# Patient Record
Sex: Male | Born: 1990 | Race: White | Hispanic: No | Marital: Single | Smoking: Current every day smoker
Health system: Southern US, Community
[De-identification: ages and names within clinical notes are randomized; demographics above are authoritative.]

---

## 2011-02-18 ENCOUNTER — Ambulatory Visit: Payer: Self-pay | Admitting: Family Medicine

## 2013-01-16 ENCOUNTER — Emergency Department: Payer: Self-pay | Admitting: Unknown Physician Specialty

## 2018-08-05 ENCOUNTER — Emergency Department: Payer: Self-pay

## 2018-08-05 ENCOUNTER — Encounter: Payer: Self-pay | Admitting: *Deleted

## 2018-08-05 ENCOUNTER — Emergency Department
Admission: EM | Admit: 2018-08-05 | Discharge: 2018-08-05 | Disposition: A | Discharge status: Home / Self Care | Payer: Self-pay | Attending: Emergency Medicine | Admitting: Emergency Medicine

## 2018-08-05 ENCOUNTER — Other Ambulatory Visit: Payer: Self-pay

## 2018-08-05 DIAGNOSIS — N23 Unspecified renal colic: Secondary | ICD-10-CM | POA: Insufficient documentation

## 2018-08-05 DIAGNOSIS — F17200 Nicotine dependence, unspecified, uncomplicated: Secondary | ICD-10-CM | POA: Insufficient documentation

## 2018-08-05 LAB — URINALYSIS, COMPLETE (UACMP) WITH MICROSCOPIC
BACTERIA UA: NONE SEEN
Bilirubin Urine: NEGATIVE
GLUCOSE, UA: NEGATIVE mg/dL
KETONES UR: NEGATIVE mg/dL
Leukocytes, UA: NEGATIVE
Nitrite: NEGATIVE
PROTEIN: 100 mg/dL — AB
Specific Gravity, Urine: 1.023 (ref 1.005–1.030)
pH: 6 (ref 5.0–8.0)

## 2018-08-05 LAB — BASIC METABOLIC PANEL
Anion gap: 8 (ref 5–15)
BUN: 12 mg/dL (ref 6–20)
CO2: 24 mmol/L (ref 22–32)
CREATININE: 0.92 mg/dL (ref 0.61–1.24)
Calcium: 9.5 mg/dL (ref 8.9–10.3)
Chloride: 107 mmol/L (ref 98–111)
GFR calc Af Amer: >60 mL/min (ref >60)
GFR calc non Af Amer: >60 mL/min (ref >60)
Glucose, Bld: 93 mg/dL (ref 70–99)
POTASSIUM: 4.1 mmol/L (ref 3.5–5.1)
SODIUM: 139 mmol/L (ref 135–145)

## 2018-08-05 LAB — CBC
HEMATOCRIT: 44.7 % (ref 40.0–52.0)
Hemoglobin: 15.8 g/dL (ref 13.0–18.0)
MCH: 31.7 pg (ref 26.0–34.0)
MCHC: 35.3 g/dL (ref 32.0–36.0)
MCV: 89.8 fL (ref 80.0–100.0)
PLATELETS: 238 10*3/uL (ref 150–440)
RBC: 4.98 MIL/uL (ref 4.40–5.90)
RDW: 12.1 % (ref 11.5–14.5)
WBC: 9 10*3/uL (ref 3.8–10.6)

## 2018-08-05 MED ORDER — TAMSULOSIN HCL 0.4 MG PO CAPS: 0.4000 mg | ORAL_CAPSULE | Freq: Every day | ORAL | 0 refills | Status: DC

## 2018-08-05 MED ORDER — KETOROLAC TROMETHAMINE 10 MG PO TABS: 10.0000 mg | ORAL_TABLET | Freq: Three times a day (TID) | ORAL | 0 refills | Status: DC | PRN

## 2018-08-05 MED ORDER — OXYCODONE-ACETAMINOPHEN 5-325 MG PO TABS: 1.0000 | ORAL_TABLET | ORAL | 0 refills | Status: AC | PRN

## 2018-08-05 MED ORDER — ONDANSETRON 4 MG PO TBDP: 4.0000 mg | ORAL_TABLET | Freq: Three times a day (TID) | ORAL | 0 refills | Status: DC | PRN

## 2018-08-05 MED ORDER — ONDANSETRON 4 MG PO TBDP
4.0000 mg | ORAL_TABLET | Freq: Once | ORAL | Status: AC
  Administered 2018-08-05: 4 mg via ORAL
  Filled 2018-08-05: qty 1

## 2018-08-05 MED ORDER — KETOROLAC TROMETHAMINE 60 MG/2ML IM SOLN
60.0000 mg | Freq: Once | INTRAMUSCULAR | Status: AC
  Administered 2018-08-05: 60 mg via INTRAMUSCULAR
  Filled 2018-08-05: qty 2

## 2018-08-05 NOTE — ED Notes (Signed)
Pt unable to void at this time. 

## 2018-08-05 NOTE — ED Provider Notes (Signed)
[facility] Emergency Department Provider Note  ____________________________________________  Time seen: Approximately 8:45 PM  I have reviewed the triage vital signs and the nursing notes.   HISTORY  Chief Complaint Hematuria and Flank Pain    HPI Dakota Jenkins is a 27 y.o. male, otherwise healthy, presenting for right flank pain radiating to the right lower quadrant.  The patient reports that yesterday he began having a dull ache, and had one episode of nausea and vomiting.  He thought he had the flu so he left work.  Today, his flank pain worsened and then he had an episode of hematuria so came straight to the emergency department.  He denies any fevers or chills.  He has no history of kidney stones.  FH: Grandfather with significant history of renal stones.  No past medical history on file.  There are no active problems to display for this patient.       Allergies Patient has no known allergies.  No family history on file.  Social History Social History   Tobacco Use  . Smoking status: Current Every Day Smoker  . Smokeless tobacco: Never Used  Substance Use Topics  . Alcohol use: Yes  . Drug use: Never    Review of Systems Constitutional: No fever/chills.  No lightheadedness or syncope. Eyes: No visual changes. ENT:  No congestion or rhinorrhea. Cardiovascular: Denies chest pain. Denies palpitations. Respiratory: Denies shortness of breath.  No cough. Gastrointestinal: + RLQ abdominal pain.  +nausea, +vomiting.  No diarrhea.  No constipation. Genitourinary: Negative for dysuria.  Positive hematuria.  No urinary frequency. Musculoskeletal: Negative for back pain except for right flank pain.. Skin: Negative for rash. Neurological: Negative for headaches. No focal numbness, tingling or weakness.     ____________________________________________   PHYSICAL EXAM:  VITAL SIGNS: ED Triage Vitals  Enc Vitals Group     BP  08/05/18 1924 (!) 142/78     Pulse Rate 08/05/18 1924 74     Resp 08/05/18 1924 20     Temp 08/05/18 1924 98.6 F (37 C)     Temp Source 08/05/18 1924 Oral     SpO2 08/05/18 1924 98 %     Weight 08/05/18 1925 250 lb (113.4 kg)     Height 08/05/18 1925 6\' 2"  (1.88 m)     Head Circumference --      Peak Flow --      Pain Score 08/05/18 1925 4     Pain Loc --      Pain Edu? --      Excl. in GC? --     Constitutional: Alert and oriented. Answers questions appropriately. Eyes: Conjunctivae are normal.  EOMI. No scleral icterus. Head: Atraumatic. Nose: No congestion/rhinnorhea. Mouth/Throat: Mucous membranes are moist.  Neck: No stridor.  Supple.   Cardiovascular: Normal rate, regular rhythm. No murmurs, rubs or gallops.  Respiratory: Normal respiratory effort.  No accessory muscle use or retractions. Lungs CTAB.  No wheezes, rales or ronchi. Gastrointestinal: Positive right CVA tenderness with mild right lower quadrant tenderness to palpation.  Soft, and nondistended.  No guarding or rebound.  No peritoneal signs. Musculoskeletal: No LE edema. No ttp in the calves or palpable cords.  Negative Homan's sign. Neurologic:  A&Ox3.  Speech is clear.  Face and smile are symmetric.  EOMI.  Moves all extremities well. Skin:  Skin is warm, dry and intact. No rash noted. Psychiatric: Mood and affect are normal. Speech and behavior are normal.  Normal judgement.  ____________________________________________   LABS (all labs ordered are listed, but only abnormal results are displayed)  Labs Reviewed  BASIC METABOLIC PANEL  CBC  URINALYSIS, COMPLETE (UACMP) WITH MICROSCOPIC   ____________________________________________  EKG  Not indicated ____________________________________________  RADIOLOGY  Ct Renal Stone Study  Result Date: 08/05/2018 CLINICAL DATA:  RIGHT flank and lower back pain for 4 days worse today, hematuria and vomiting today, does heavy lifting at work, history smoking  EXAM: CT ABDOMEN AND PELVIS WITHOUT CONTRAST TECHNIQUE: Multidetector CT imaging of the abdomen and pelvis was performed following the standard protocol without IV contrast. Sagittal and coronal MPR images reconstructed from axial data set. Oral contrast not administered for this indication. COMPARISON:  None. FINDINGS: Lower chest: Lung bases clear Hepatobiliary: Gallbladder and liver normal appearance Pancreas: Normal appearance Spleen: Normal appearance Adrenals/Urinary Tract: Adrenal glands normal appearance. Mild RIGHT hydronephrosis secondary to a 2 mm RIGHT ureteropelvic junction calculus. No evidence of renal mass or additional calcification. Unremarkable ureters and bladder. Stomach/Bowel: Normal appendix. Stomach and bowel loops normal appearance Vascular/Lymphatic: Aorta normal caliber. No adenopathy. Normal sized inguinal nodes. Reproductive: Prostatic enlargement, gland 4.4 x 4.5 x 3.8 cm (volume = 39 cm^3) Other: No free air or free fluid. No hernia or additional inflammatory process. Musculoskeletal: Unremarkable IMPRESSION: Mild RIGHT hydronephrosis secondary to a 2 mm RIGHT UPJ calculus. Prostatic enlargement. Electronically Signed   By: Ulyses SouthwardMark  Boles M.D.   On: 08/05/2018 20:31    ____________________________________________   PROCEDURES  Procedure(s) performed: None  Procedures  Critical Care performed: No ____________________________________________   INITIAL IMPRESSION / ASSESSMENT AND PLAN / ED COURSE  Pertinent labs & imaging results that were available during my care of the patient were reviewed by me and considered in my medical decision making (see chart for details).  27 y.o. male, otherwise healthy with a family history of renal colic, presenting with right flank pain radiating to the right lower quadrant and one episode of nausea and vomiting yesterday.  Overall, the patient is dynamically stable and afebrile.  He has a work-up in the emergency department which is most  consistent with renal colic.  He has a CT scan which is a 2 mm UPJ stone without any significant complications.  He has had a normal white blood cell count at 9.0 and a creatinine of 0.92.  His urinalysis is pending.  At this time, the patient's pain has significantly improved without intervention.  He will be given Toradol, Zofran, and oral hydration.  I have discussed expectant management, follow-up instructions as well as return precautions with the patient.  ____________________________________________  FINAL CLINICAL IMPRESSION(S) / ED DIAGNOSES  Final diagnoses:  Renal colic on right side         NEW MEDICATIONS STARTED DURING THIS VISIT:  New Prescriptions   KETOROLAC (TORADOL) 10 MG TABLET    Take 1 tablet (10 mg total) by mouth every 8 (eight) hours as needed for moderate pain (with food).   ONDANSETRON (ZOFRAN ODT) 4 MG DISINTEGRATING TABLET    Take 1 tablet (4 mg total) by mouth every 8 (eight) hours as needed for nausea or vomiting.   OXYCODONE-ACETAMINOPHEN (PERCOCET) 5-325 MG TABLET    Take 1 tablet by mouth every 4 (four) hours as needed for severe pain.   TAMSULOSIN (FLOMAX) 0.4 MG CAPS CAPSULE    Take 1 capsule (0.4 mg total) by mouth daily.      Rockne MenghiniNorman, Anne-Caroline, MD 08/05/18 2055

## 2018-08-05 NOTE — Discharge Instructions (Addendum)
Please drink plenty of fluid to be well-hydrated and to help your kidney stone passed.  You may take Toradol for mild to moderate pain, and Percocet for severe pain.  Do not drive within 8 hours of taking Percocet.  Zofran is for nausea and vomiting.  Please make a follow-up appointment with the urology doctor.  Please strain your urine and if you capture the stone, bring it with you to your appointment.  Return to the emergency department if you develop severe pain, lightheadedness or fainting fever, inability to keep down fluids, or any other symptoms concerning to you.

## 2018-08-05 NOTE — ED Triage Notes (Signed)
Pt has right flank pain with hematuria.  Pt vomited x 1.  No hx kidney stones  Pt alert

## 2018-08-12 ENCOUNTER — Encounter: Payer: Self-pay | Admitting: Urology

## 2018-08-12 ENCOUNTER — Ambulatory Visit (INDEPENDENT_AMBULATORY_CARE_PROVIDER_SITE_OTHER): Payer: Self-pay | Admitting: Urology

## 2018-08-12 VITALS — BP 130/75 | HR 66 | Ht 74.0 in | Wt 246.8 lb

## 2018-08-12 DIAGNOSIS — N200 Calculus of kidney: Secondary | ICD-10-CM

## 2018-08-12 DIAGNOSIS — N201 Calculus of ureter: Secondary | ICD-10-CM

## 2018-08-12 LAB — URINALYSIS, COMPLETE
Bilirubin, UA: NEGATIVE
Glucose, UA: NEGATIVE
KETONES UA: NEGATIVE
LEUKOCYTES UA: NEGATIVE
NITRITE UA: NEGATIVE
PH UA: 6 (ref 5.0–7.5)
SPEC GRAV UA: 1.025 (ref 1.005–1.030)
Urobilinogen, Ur: 1 mg/dL (ref 0.2–1.0)

## 2018-08-12 LAB — MICROSCOPIC EXAMINATION
Bacteria, UA: NONE SEEN
EPITHELIAL CELLS (NON RENAL): NONE SEEN /HPF (ref 0–10)

## 2018-08-12 NOTE — Progress Notes (Signed)
08/12/2018 4:02 PM   Dakota CoventryJoshua T Jenkins Jun 08, 1991 [id number]  CC: Right flank pain  HPI: The pleasure of seeing Dakota Jenkins in urology clinic today for right flank pain.  He was seen in the emergency room on 8/22 with acute onset of right-sided flank pain and hematuria, and CT scan showed a 2 mm right proximal ureteral stone.  He has never had kidney stones before.  He has been straining his urine and has not caught a stone yet.  He continues to have episodes of acute onset right renal colic with some radiation to the right groin.  His pain is controlled with Toradol and narcotics.  Denies nausea or vomiting.  Denies fevers, chills.  Strong family history of kidney stones in his grandfather.   PMH: None  Allergies: No Known Allergies  Family History: Family History  Problem Relation Age of Onset  . Bladder Cancer Neg Hx   . Prostate cancer Neg Hx   . Kidney cancer Neg Hx     Social History:  reports that he has been smoking. He has never used smokeless tobacco. He reports that he drinks alcohol. He reports that he does not use drugs.  ROS: Please see flowsheet from today's date for complete review of systems.  Physical Exam: BP 130/75 (BP Location: Left Arm, Patient Position: Sitting, Cuff Size: Normal)   Pulse 66   Ht 6\' 2"  (1.88 m)   Wt 246 lb 12.8 oz (111.9 kg)   BMI 31.69 kg/m    Constitutional:  Alert and oriented, No acute distress. Cardiovascular: No clubbing, cyanosis, or edema. Respiratory: Normal respiratory effort, no increased work of breathing. GI: Abdomen is soft, nontender, nondistended, no abdominal masses GU: Right CVA tendernesss Lymph: No cervical or inguinal lymphadenopathy. Skin: No rashes, bruises or suspicious lesions. Neurologic: Grossly intact, no focal deficits, moving all 4 extremities. Psychiatric: Normal mood and affect.  Laboratory Data: Urinalysis 8/22 noninfected   Pertinent Imaging: I have personally reviewed the CT stone  protocol 8/22: 2 mm right UPJ stone with moderate hydronephrosis.   Assessment & Plan:   Dakota Jenkins is a 27 year old healthy male with a noninfected right 2 mm UPJ stone.  We discussed various treatment options for urolithiasis including observation with or without medical expulsive therapy, shockwave lithotripsy (SWL), ureteroscopy and laser lithotripsy with stent placement, and percutaneous nephrolithotomy.  We discussed that management is based on stone size, location, density, patient co-morbidities, and patient preference.   Stones <455mm in size have a >80% spontaneous passage rate. Data surrounding the use of tamsulosin for medical expulsive therapy is controversial, but meta analyses suggests it is most efficacious for distal stones between 5-3410mm in size. Possible side effects include dizziness/lightheadedness, and retrograde ejaculation.  SWL has a lower stone free rate in a single procedure, but also a lower complication rate compared to ureteroscopy and avoids a stent and associated stent related symptoms. Possible complications include renal hematoma, steinstrasse, and need for additional treatment.  Ureteroscopy with laser lithotripsy and stent placement has a higher stone free rate than SWL in a single procedure, however increased complication rate including possible infection, ureteral injury, bleeding, and stent related morbidity. Common stent related symptoms include dysuria, urgency/frequency, and flank pain.  After an extensive discussion of the risks and benefits of the above treatment options, the patient would like to proceed with observation. Will continue to strain urine. Will cancel follow up ultrasound if he passes his stone, if not, with the small size of the stone is  a better candidate for URS over SWL.  Return in about 4 weeks (around 09/09/2018) for renal ultrasound.  Sondra Come, MD  [facility] [address] [place], [place] [address] [phone]

## 2018-08-12 NOTE — Patient Instructions (Signed)

## 2018-08-19 ENCOUNTER — Encounter: Payer: Self-pay | Admitting: Urology

## 2018-09-07 ENCOUNTER — Ambulatory Visit
Admission: RE | Admit: 2018-09-07 | Discharge: 2018-09-07 | Disposition: A | Discharge status: Home / Self Care | Payer: Self-pay | Source: Ambulatory Visit | Attending: Urology | Admitting: Urology

## 2018-09-07 DIAGNOSIS — N201 Calculus of ureter: Secondary | ICD-10-CM

## 2018-09-07 DIAGNOSIS — Z87442 Personal history of urinary calculi: Secondary | ICD-10-CM | POA: Insufficient documentation

## 2018-09-07 DIAGNOSIS — N1330 Unspecified hydronephrosis: Secondary | ICD-10-CM | POA: Insufficient documentation

## 2018-09-10 ENCOUNTER — Ambulatory Visit: Payer: Self-pay | Admitting: Urology

## 2018-09-10 ENCOUNTER — Ambulatory Visit (INDEPENDENT_AMBULATORY_CARE_PROVIDER_SITE_OTHER): Payer: Self-pay | Admitting: Urology

## 2018-09-10 ENCOUNTER — Encounter: Payer: Self-pay | Admitting: Urology

## 2018-09-10 VITALS — BP 155/81 | HR 71 | Resp 16 | Ht 74.0 in | Wt 249.2 lb

## 2018-09-10 DIAGNOSIS — N201 Calculus of ureter: Secondary | ICD-10-CM

## 2018-09-10 NOTE — Progress Notes (Signed)
   09/10/2018 2:46 PM   VALEN GILLISON [birth year] [id number]  Reason for visit: Follow up 2 mm right ureteral stone  HPI: I saw Mr. Kathi Der back in urology clinic today for follow-up of a 2 mm right UPJ stone.  When I saw him on 08/12/2018 he was having right-sided flank pain secondary to a 2 mm right UPJ stone.  He has only intermittently strained his urine since that time.  He denies any recurrence of his right-sided flank pain, and has been doing well.  He denies gross hematuria, nausea, or chills, or vomiting.  Renal ultrasound 09/07/2018 showed almost complete resolution of his right-sided hydronephrosis, and no proximal ureteral stone.   ROS: Please see flowsheet from today's date for complete review of systems.  Physical Exam: BP (!) 155/81 (BP Location: Left Arm, Patient Position: Sitting, Cuff Size: Normal)   Pulse 71   Resp 16   Ht 6\' 2"  (1.88 m)   Wt 249 lb 3.2 oz (113 kg)   BMI 32.00 kg/m    Constitutional:  Alert and oriented, No acute distress. Respiratory: Normal respiratory effort, no increased work of breathing. GI: Abdomen is soft, nontender, nondistended, no abdominal masses GU: no CVA tenderness Skin: No rashes, bruises or suspicious lesions. Neurologic: Grossly intact, no focal deficits, moving all 4 extremities. Psychiatric: Normal mood and affect  Pertinent Imaging:  I have personally reviewed the renal ultrasound dated 09/07/2018: Near complete resolution of the right-sided hydronephrosis.  Assessment & Plan:   In summary, Mr. Monsanto is a healthy 27 year old male who appears to have spontaneously passed his 2 mm right proximal ureteral stone.  His pain is completely resolved and ultrasound shows near complete resolution of his prior right-sided hydronephrosis.  We discussed general stone prevention strategies including adequate hydration with goal of producing 2.5 L of urine daily, increasing citric acid intake, increasing calcium intake during high  oxalate meals, minimizing animal protein, and decreasing salt intake. Information about dietary recommendations given today.   Follow up PRN  Sondra Come, MD  [facility] [address] [place], [place] [address] [phone]

## 2019-10-21 IMAGING — CT CT RENAL STONE PROTOCOL
2 of 4 series · 16 of 46 positions shown, 18 images · non-contrast
Comparison: None.

CLINICAL DATA: RIGHT flank and lower back pain for 4 days worse
today, hematuria and vomiting today, does heavy lifting at work,
history smoking

EXAM:
CT ABDOMEN AND PELVIS WITHOUT CONTRAST
TECHNIQUE: Multidetector CT imaging of the abdomen and pelvis was performed
following the standard protocol without IV contrast. Sagittal and
coronal MPR images reconstructed from axial data set. Oral contrast
not administered for this indication.

[Series 2: stone full standard · axial · 0.81mm/px · z∈[-1112,-622]mm · 13 of 108 slices shown, 15 images]
[im 5/108  soft-tissue]
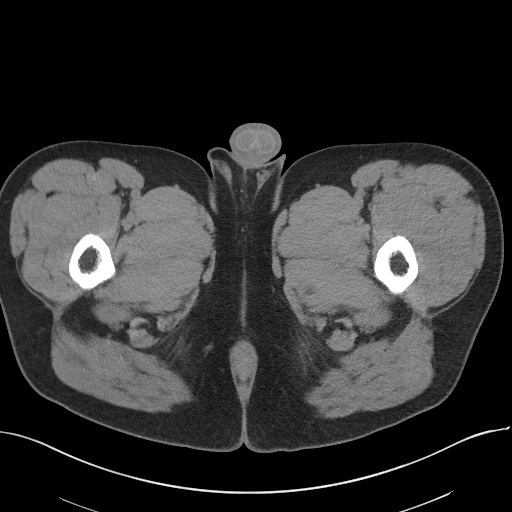
[im 5/108  bone]
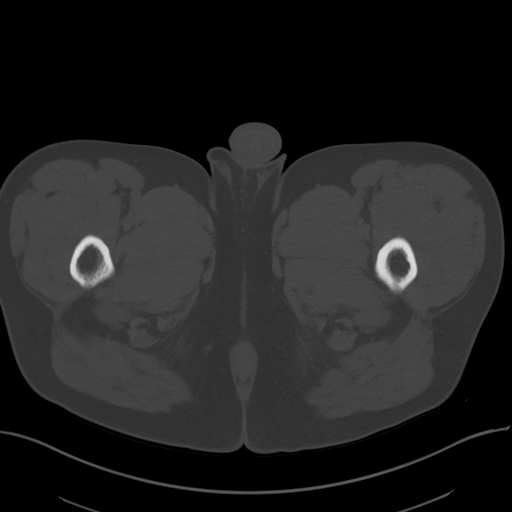
[im 14/108  soft-tissue]
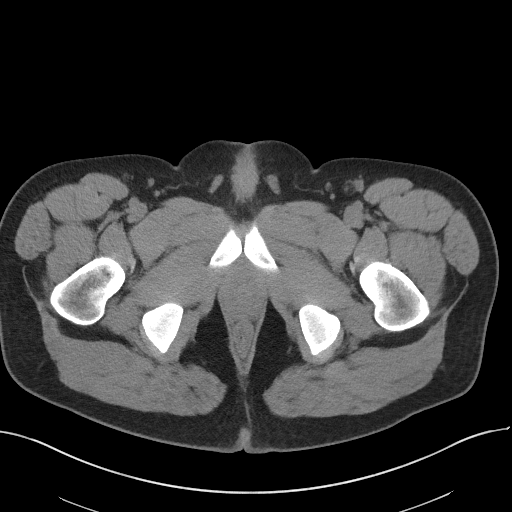
[im 23/108  soft-tissue]
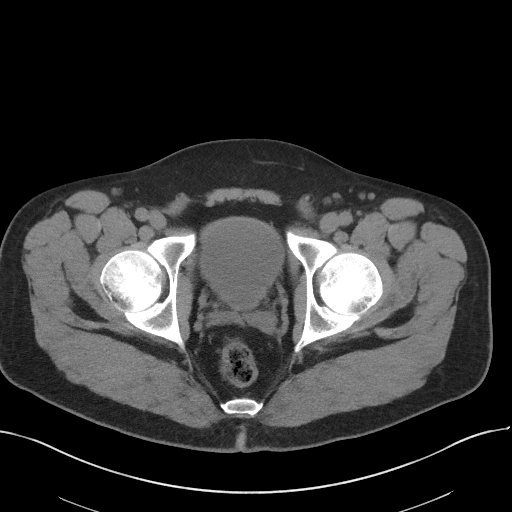
[im 32/108  soft-tissue]
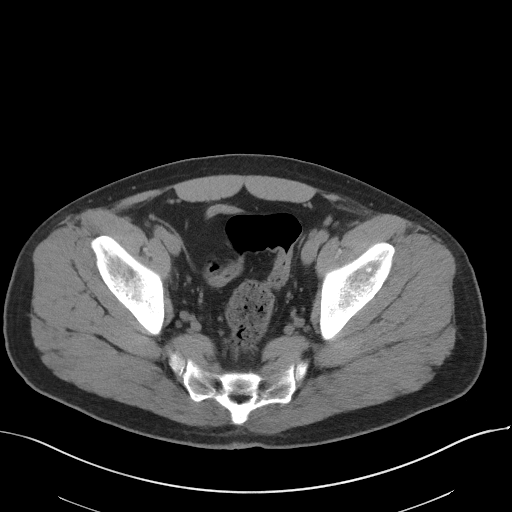
[im 36/108  soft-tissue]
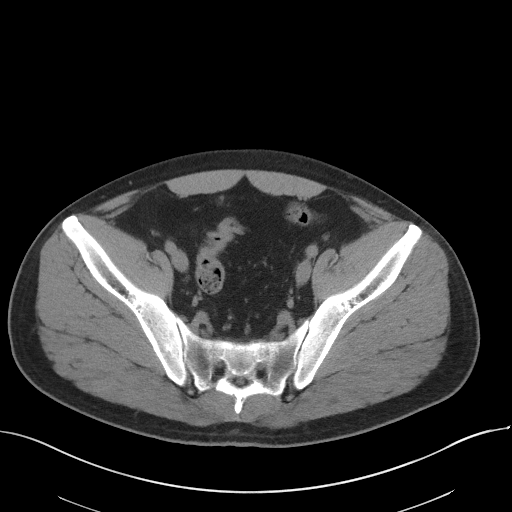
[im 45/108  soft-tissue]
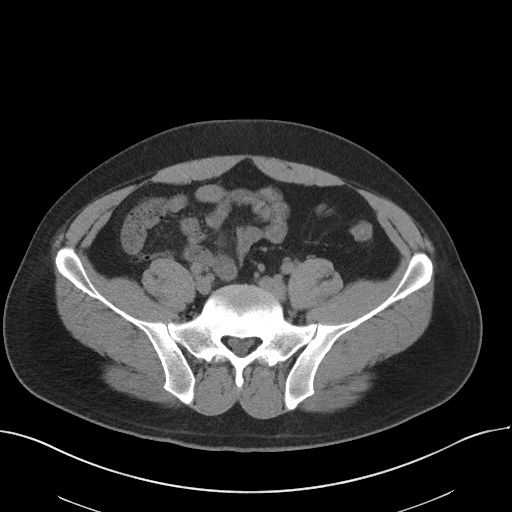
[im 54/108  soft-tissue]
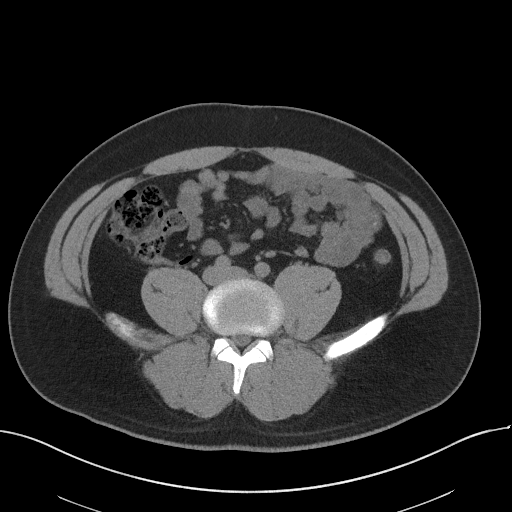
[im 63/108  soft-tissue]
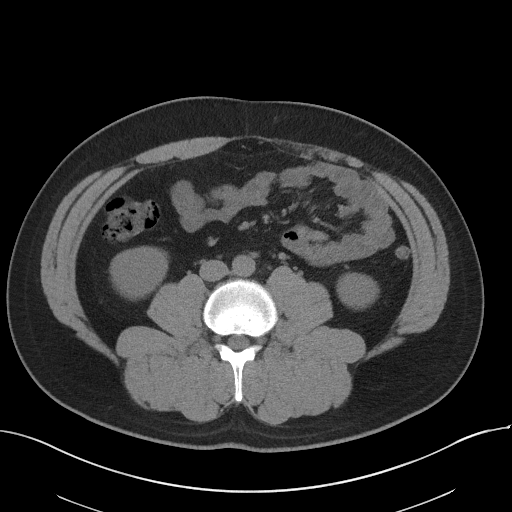
[im 72/108  soft-tissue]
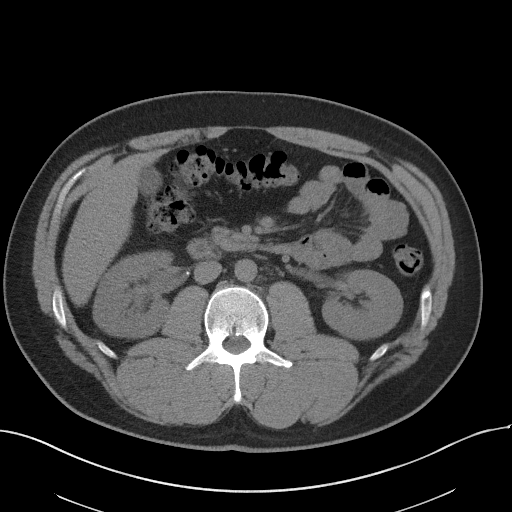
[im 72/108  bone]
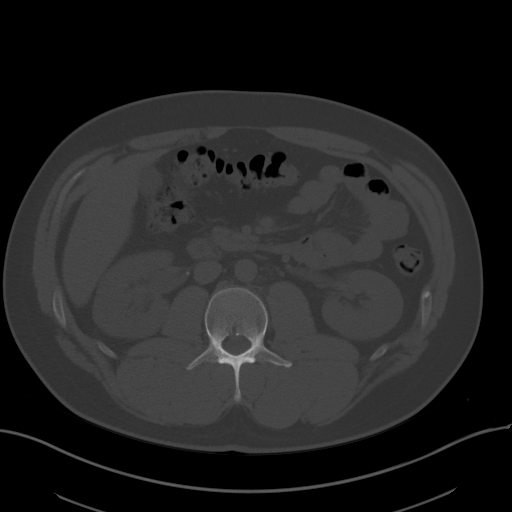
[im 76/108  soft-tissue]
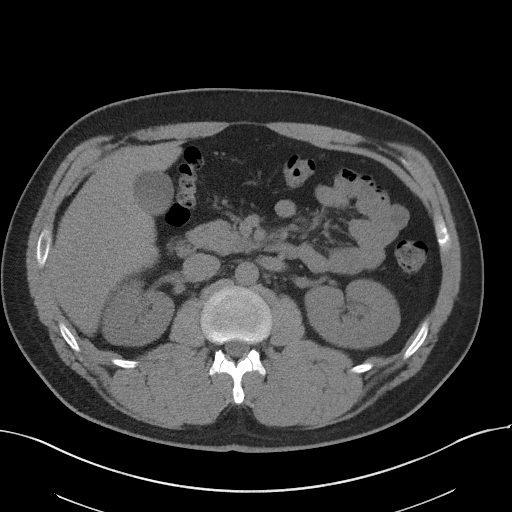
[im 85/108  soft-tissue]
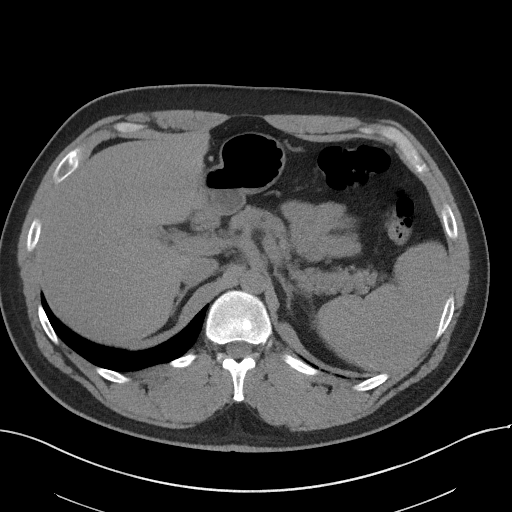
[im 94/108  soft-tissue]
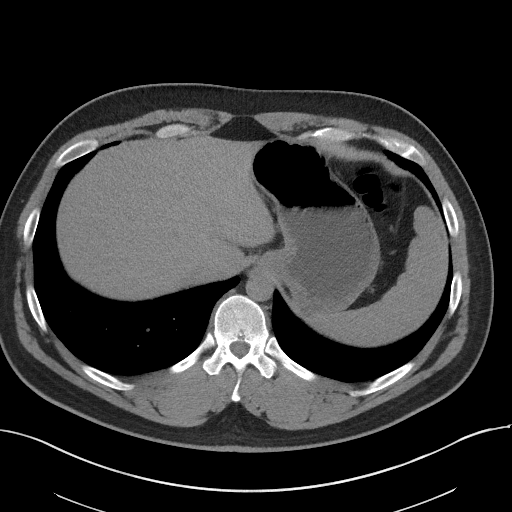
[im 103/108  soft-tissue]
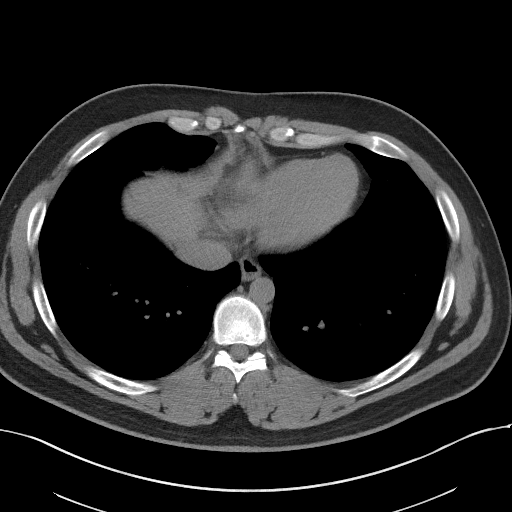

[Series 5: coronal · coronal · 0.80mm/px · 3 of 117 slices shown]
[im 39/117  soft-tissue]
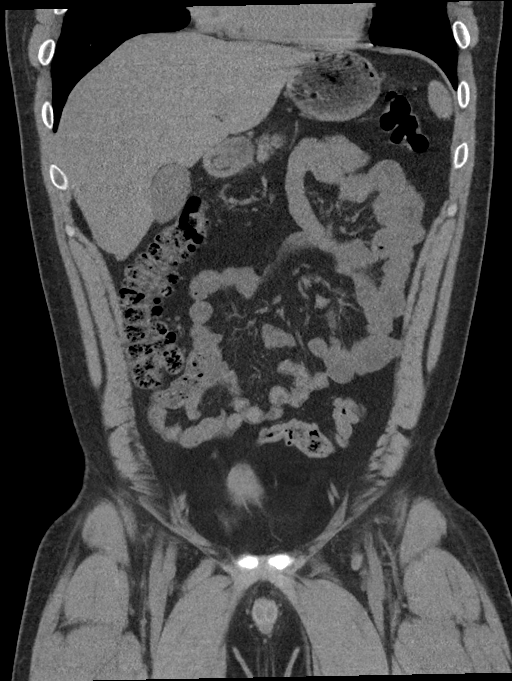
[im 52/117  soft-tissue]
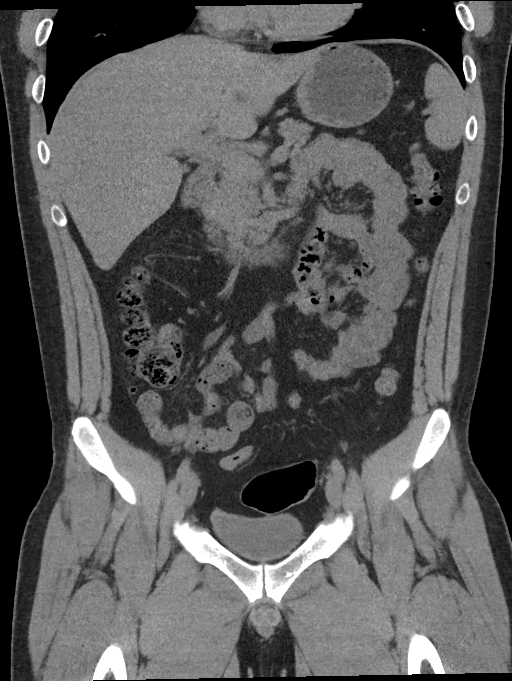
[im 65/117  soft-tissue]
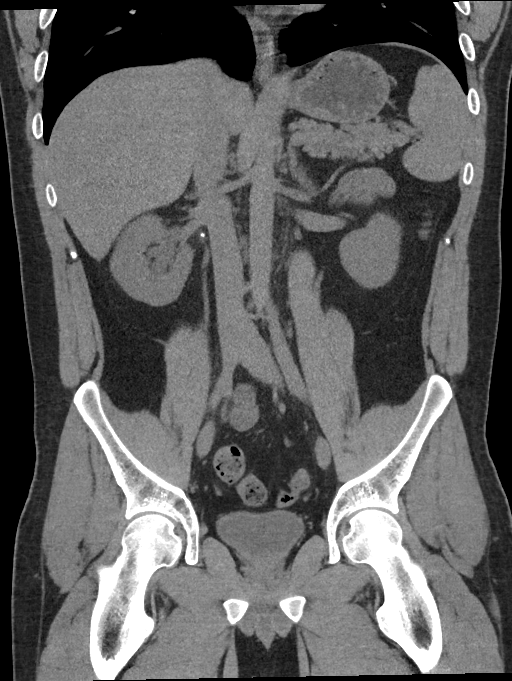

[16 of 46 positions shown; findings below may reference images not displayed]

FINDINGS: Lower chest: Lung bases clear

Hepatobiliary: Gallbladder and liver normal appearance

Pancreas: Normal appearance

Spleen: Normal appearance

Adrenals/Urinary Tract: Adrenal glands normal appearance. Mild RIGHT
hydronephrosis secondary to a 2 mm RIGHT ureteropelvic junction
calculus. No evidence of renal mass or additional calcification.
Unremarkable ureters and bladder.

Stomach/Bowel: Normal appendix. Stomach and bowel loops normal
appearance

Vascular/Lymphatic: Aorta normal caliber. No adenopathy. Normal
sized inguinal nodes.

Reproductive: Prostatic enlargement, gland 4.4 x 4.5 x 3.8 cm
(volume = 39 cm^3)

Other: No free air or free fluid. No hernia or additional
inflammatory process.

Musculoskeletal: Unremarkable
IMPRESSION: Mild RIGHT hydronephrosis secondary to a 2 mm RIGHT UPJ calculus.

Prostatic enlargement.

## 2020-08-07 DIAGNOSIS — M7542 Impingement syndrome of left shoulder: Secondary | ICD-10-CM | POA: Insufficient documentation

## 2022-04-13 ENCOUNTER — Emergency Department: Payer: Self-pay

## 2022-04-13 ENCOUNTER — Emergency Department
Admission: EM | Admit: 2022-04-13 | Discharge: 2022-04-13 | Disposition: A | Discharge status: Home / Self Care | Payer: Self-pay | Attending: Emergency Medicine | Admitting: Emergency Medicine

## 2022-04-13 ENCOUNTER — Other Ambulatory Visit: Payer: Self-pay

## 2022-04-13 DIAGNOSIS — R0789 Other chest pain: Secondary | ICD-10-CM | POA: Insufficient documentation

## 2022-04-13 LAB — CBC
HCT: 39.6 % (ref 39.0–52.0)
Hemoglobin: 13.5 g/dL (ref 13.0–17.0)
MCH: 30.8 pg (ref 26.0–34.0)
MCHC: 34.1 g/dL (ref 30.0–36.0)
MCV: 90.2 fL (ref 80.0–100.0)
Platelets: 216 10*3/uL (ref 150–400)
RBC: 4.39 MIL/uL (ref 4.22–5.81)
RDW: 11.5 % (ref 11.5–15.5)
WBC: 6 10*3/uL (ref 4.0–10.5)
nRBC: 0 % (ref 0.0–0.2)

## 2022-04-13 LAB — COMPREHENSIVE METABOLIC PANEL
ALT: 24 U/L (ref 0–44)
AST: 23 U/L (ref 15–41)
Albumin: 4.4 g/dL (ref 3.5–5.0)
Alkaline Phosphatase: 56 U/L (ref 38–126)
Anion gap: 6 (ref 5–15)
BUN: 15 mg/dL (ref 6–20)
CO2: 26 mmol/L (ref 22–32)
Calcium: 9.4 mg/dL (ref 8.9–10.3)
Chloride: 107 mmol/L (ref 98–111)
Creatinine, Ser: 0.92 mg/dL (ref 0.61–1.24)
GFR, Estimated: >60 mL/min (ref >60)
Glucose, Bld: 99 mg/dL (ref 70–99)
Potassium: 4 mmol/L (ref 3.5–5.1)
Sodium: 139 mmol/L (ref 135–145)
Total Bilirubin: 0.6 mg/dL (ref 0.3–1.2)
Total Protein: 7.4 g/dL (ref 6.5–8.1)

## 2022-04-13 LAB — LIPASE, BLOOD: Lipase: 30 U/L (ref 11–51)

## 2022-04-13 LAB — TROPONIN I (HIGH SENSITIVITY)
Troponin I (High Sensitivity): 3 ng/L (ref <18)
Troponin I (High Sensitivity): 3 ng/L (ref <18)

## 2022-04-13 NOTE — ED Provider Notes (Signed)
? ?[facility] ?Provider Note ? ? ? Event Date/Time  ? First MD Initiated Contact with Patient 04/13/22 1759   ?  (approximate) ? ? ?History  ? ?Chest Pain ? ? ?HPI ? ?Dakota Jenkins is a 31 y.o. male who reports being otherwise healthy although does vape who comes in with concerns for chest pain.  Patient reports doing a lot of heavy lifting yesterday.  He states that he was just sitting on the couch when he had sudden onset of stabbing pain in the left side of his chest.  This lasted for a few minutes and started about an hour ago but then went away.  He denies any shortness of breath associated with it.  He reports feelings of discomfort in his left arm but his since his symptoms have mostly resolved.  He denies any shortness of breath, leg swelling, recent travel, recent surgery.  He denies any abdominal pain. ?  ? ? ?Physical Exam  ? ?Triage Vital Signs: ?ED Triage Vitals  ?Enc Vitals Group  ?   BP 04/13/22 1750 135/79  ?   Pulse Rate 04/13/22 1750 (!) 58  ?   Resp 04/13/22 1750 16  ?   Temp 04/13/22 1750 98 ?F (36.7 ?C)  ?   Temp Source 04/13/22 1750 Oral  ?   SpO2 04/13/22 1750 100 %  ?   Weight 04/13/22 1751 250 lb (113.4 kg)  ?   Height 04/13/22 1751 6\' 2"  (1.88 m)  ?   Head Circumference --   ?   Peak Flow --   ?   Pain Score 04/13/22 1751 8  ?   Pain Loc --   ?   Pain Edu? --   ?   Excl. in GC? --   ? ? ?Most recent vital signs: ?Vitals:  ? 04/13/22 1750  ?BP: 135/79  ?Pulse: (!) 58  ?Resp: 16  ?Temp: 98 ?F (36.7 ?C)  ?SpO2: 100%  ? ? ? ?General: Awake, no distress.  ?CV:  Good peripheral perfusion.  No chest wall tenderness.  No rash ?Resp:  Normal effort.  ?Abd:  No distention.  Soft and nontender ?Other:  Equal strength in arms and legs.  Sensation intact in arms and legs.  No calf swelling.  No calf tenderness. ? ? ?ED Results / Procedures / Treatments  ? ?Labs ?(all labs ordered are listed, but only abnormal results are displayed) ?Labs Reviewed  ?BASIC METABOLIC PANEL  ?CBC   ?TROPONIN I (HIGH SENSITIVITY)  ? ? ? ?EKG ? ?My interpretation of EKG: ? ?Sinus bradycardia rate of 58 without any ST elevation or T wave inversions, normal intervals ? ?Normal sinus rateRADIOLOGY ?I have reviewed the xray personally and no PNA/ptx  ? ? ? ?PROCEDURES: ? ?Critical Care performed: No ? ?.1-3 Lead EKG Interpretation ?Performed by: 04/15/22, MD ?Authorized by: Concha Se, MD  ? ?  Interpretation: abnormal   ?  ECG rate:  50 ?  ECG rate assessment: bradycardic   ?  Rhythm: sinus bradycardia   ?  Ectopy: none   ?  Conduction: normal   ? ? ?MEDICATIONS ORDERED IN ED: ?Medications - No data to display ? ? ?IMPRESSION / MDM / ASSESSMENT AND PLAN / ED COURSE  ?I reviewed the triage vital signs and the nursing notes. ? ?Patient comes in with some atypically sharp stabbing chest pain in his left chest wall.  He denies any exertional chest pain but EKG, cardiac  markers were ordered evaluate for ACS.  Abdomen is soft and nontender lower suspicion for acute abdominal pathology.  Patient denies shortness of breath or risk factors and PERC negative.  ? ?Tropes negative x2.  CBC normal.  CMP normal.  Lipase normal ? ?9:00 PM repeat evaluation patient remains chest pain-free. ? ? ?Considered admission given patient came in with chest pain but ? Patient's heart score is less than 3 as well as the fact that patient chest pain sounds atypical I feel it is reasonable to follow-up outpatient.  This could be from overuse from his workout yesterday and we discussed Tylenol and/or ibuprofen to help with any additional pain and return if symptoms are changing or worsening.  Given him cardiology number for follow-up as well ? ? ? ? ?The patient is on the cardiac monitor to evaluate for evidence of arrhythmia and/or significant heart rate changes. ? ?  ? ? ?FINAL CLINICAL IMPRESSION(S) / ED DIAGNOSES  ? ?Final diagnoses:  ?Atypical chest pain  ? ? ? ?Rx / DC Orders  ? ?ED Discharge Orders   ? ? None  ? ?  ? ? ? ?Note:   This document was prepared using Dragon voice recognition software and may include unintentional dictation errors. ?  ?Concha Se, MD ?04/13/22 2101 ? ?

## 2022-04-13 NOTE — ED Triage Notes (Signed)
Pt here with cp that started an hour ago. Pt states pain is left sided and does not radiate. Pt states he had a few episodes of nausea but none currently. Pt ambulatory to treatment room. ?

## 2022-04-13 NOTE — ED Notes (Signed)
IV removed at this time

## 2022-04-13 NOTE — Discharge Instructions (Addendum)
Your work-up was reassuring and if needed you can take a little bit of Tylenol and/or ibuprofen to help with your pain and return to the ER if you develop worsening symptoms or any other concerns.  You can call cardiology to make a follow-up ?

## 2022-11-04 ENCOUNTER — Emergency Department: Payer: Self-pay

## 2022-11-04 ENCOUNTER — Emergency Department
Admission: EM | Admit: 2022-11-04 | Discharge: 2022-11-04 | Disposition: A | Discharge status: Home / Self Care | Payer: Self-pay | Attending: Student in an Organized Health Care Education/Training Program | Admitting: Student in an Organized Health Care Education/Training Program

## 2022-11-04 ENCOUNTER — Encounter: Payer: Self-pay | Admitting: Emergency Medicine

## 2022-11-04 ENCOUNTER — Other Ambulatory Visit: Payer: Self-pay

## 2022-11-04 DIAGNOSIS — R252 Cramp and spasm: Secondary | ICD-10-CM | POA: Insufficient documentation

## 2022-11-04 DIAGNOSIS — R011 Cardiac murmur, unspecified: Secondary | ICD-10-CM | POA: Insufficient documentation

## 2022-11-04 DIAGNOSIS — R0789 Other chest pain: Secondary | ICD-10-CM | POA: Insufficient documentation

## 2022-11-04 LAB — CBC
HCT: 39.3 % (ref 39.0–52.0)
Hemoglobin: 13.7 g/dL (ref 13.0–17.0)
MCH: 30.9 pg (ref 26.0–34.0)
MCHC: 34.9 g/dL (ref 30.0–36.0)
MCV: 88.5 fL (ref 80.0–100.0)
Platelets: 213 10*3/uL (ref 150–400)
RBC: 4.44 MIL/uL (ref 4.22–5.81)
RDW: 11.7 % (ref 11.5–15.5)
WBC: 5.7 10*3/uL (ref 4.0–10.5)
nRBC: 0 % (ref 0.0–0.2)

## 2022-11-04 LAB — BASIC METABOLIC PANEL
Anion gap: 6 (ref 5–15)
BUN: 14 mg/dL (ref 6–20)
CO2: 25 mmol/L (ref 22–32)
Calcium: 9.2 mg/dL (ref 8.9–10.3)
Chloride: 106 mmol/L (ref 98–111)
Creatinine, Ser: 1.08 mg/dL (ref 0.61–1.24)
GFR, Estimated: >60 mL/min (ref >60)
Glucose, Bld: 177 mg/dL — ABNORMAL HIGH (ref 70–99)
Potassium: 3.5 mmol/L (ref 3.5–5.1)
Sodium: 137 mmol/L (ref 135–145)

## 2022-11-04 LAB — TROPONIN I (HIGH SENSITIVITY): Troponin I (High Sensitivity): 3 ng/L (ref <18)

## 2022-11-04 NOTE — Discharge Instructions (Addendum)
-  The cardiology office should be contacting you within the next 2 days.  If they do not, please use the contact information to call and schedule appointment.  -Please establish with a primary care provider and discuss screening/treatment for anxiety/depression.  -Your chest x-ray did show some mild bronchial thickening.  Recommend reducing her amount of smoking/vaping to prevent worsening.  -You may trial omeprazole (Prilosec) 40 mg daily to see if this helps alleviate some of your symptoms in case this is related to heartburn.  -In regards to the mass along your upper arm, you may schedule appoint with the dermatology office using the contact information listed.  -Return to the emergency department anytime if you begin to experience any new or worsening symptoms.

## 2022-11-04 NOTE — ED Triage Notes (Signed)
Pt sts that he was eating dinner and started to have CP. Pt sts that this has happened to him before but there was no result as to why he was having it.

## 2022-11-04 NOTE — ED Provider Notes (Addendum)
[facility] Provider Note    Event Date/Time   First MD Initiated Contact with Patient 11/04/22 1843     (approximate)   History   Chief Complaint Chest Pain   HPI Dakota Jenkins is a 31 y.o. male, no significant medical history, presents to the emergency department for evaluation of chest pain.  Patient notes that he was eating dinner this evening when he began having a central tightening in his chest.  He states that he felt his arms and legs cramp up as well.  He states this is happened to him in the past and is not sure what is causing it.  He says that his symptoms have mostly resolved at this time.  Denies fever/chills, shortness of breath, abdominal pain, flank pain, nausea/vomiting, diarrhea, dysuria, headache, weakness, vision change, hearing change, rash/lesions, or dizziness/lightheadedness.  History Limitations: No limitations.        Physical Exam  Triage Vital Signs: ED Triage Vitals  Enc Vitals Group     BP 11/04/22 1814 (!) 155/81     Pulse Rate 11/04/22 1814 74     Resp 11/04/22 1814 18     Temp 11/04/22 1814 98 F (36.7 C)     Temp Source 11/04/22 1814 Oral     SpO2 11/04/22 1814 98 %     Weight 11/04/22 1812 240 lb (108.9 kg)     Height --      Head Circumference --      Peak Flow --      Pain Score 11/04/22 1812 4     Pain Loc --      Pain Edu? --      Excl. in GC? --     Most recent vital signs: Vitals:   11/04/22 1900 11/04/22 1930  BP: (!) 168/88 (!) 159/72  Pulse: 96 64  Resp: 20 11  Temp:    SpO2: 100% 97%    General: Awake, NAD.  Skin: Warm, dry. No rashes or lesions.  Eyes: PERRL. Conjunctivae normal.  CV: Good peripheral perfusion.  No chest wall tenderness.  Mild systolic murmur.  No rubs or gallops. Resp: Normal effort.  Abd: Soft, non-tender. No distention.  Neuro: At baseline. No gross neurological deficits.  Musculoskeletal: Normal ROM of all extremities.  Physical Exam    ED Results /  Procedures / Treatments  Labs (all labs ordered are listed, but only abnormal results are displayed) Labs Reviewed  BASIC METABOLIC PANEL - Abnormal; Notable for the following components:      Result Value   Glucose, Bld 177 (*)    All other components within normal limits  CBC  TROPONIN I (HIGH SENSITIVITY)  TROPONIN I (HIGH SENSITIVITY)     EKG Sinus rhythm, rate of 77, no significant ST segment changes, no axis deviations, normal QRS, no QT prolongation.    RADIOLOGY  ED Provider Interpretation: I personally viewed and interpreted this x-ray, mild bronchial thickening present.  DG Chest 2 View  Result Date: 11/04/2022 CLINICAL DATA:  Chest pain. EXAM: CHEST - 2 VIEW COMPARISON:  04/13/2022 FINDINGS: The cardiomediastinal contours are normal. Mild bronchial thickening. Pulmonary vasculature is normal. No consolidation, pleural effusion, or pneumothorax. No acute osseous abnormalities are seen. IMPRESSION: Mild bronchial thickening. Electronically Signed   By: Narda Rutherford M.D.   On: 11/04/2022 19:02    PROCEDURES:  Critical Care performed: N/A.  Procedures    MEDICATIONS ORDERED IN ED: Medications - No data to display  IMPRESSION / MDM / ASSESSMENT AND PLAN / ED COURSE  I reviewed the triage vital signs and the nursing notes.                              Differential diagnosis includes, but is not limited to, ACS, GERD, PE, anxiety/depression, costochondritis, [place], marked/pericarditis.  ED Course Patient appears well, vitals within normal limits.  NAD.  CBC shows no leukocytosis, anemia, or thrombocytopenia.  BMP shows no significant electrolyte abnormalities or AKI.  Initial troponin 3, consistent with previous values.  Assessment/Plan Patient presents with sudden onset chest pain that started while he was eating this evening.  His chest pain has mostly resolved at this time.  His physical exam is overall unremarkable.  Lab work-up is  unremarkable.  Troponin is 3, consistent with his previous values.  His EKG shows sinus rhythm no evidence of ST segment elevation or other abnormalities.  He has had a presentation like this in the past, which ultimately resolved in a negative work-up as well.  He is PERC negative.  Heart score of 0.  Patient was provided with a referral to cardiology last time, however he did not follow-up with them.  Strongly encouraged him to follow-up with them for further evaluation.  We will provide him with a new referral.  However, I do not suspect any serious or life-threatening pathology at this time.  He does endorse several recent stressors in his life and what he describes as possible anxiety due to recently having kids.  I suspect that this is likely the etiology of his symptoms.  He does not have a primary care provider at this time.  Strongly encouraged him to establish with a PCP and discuss possibility of treatment for anxiety/depression.  Because his symptoms are also somewhat associated with food at times, this could be a presentation related to GERD.  Recommend they take 40 mg omeprazole daily for 1 month to see if this helps improve his symptoms.  He was amenable to this.  Prior to discharge, patient did state that he has also had a lump along the medial aspect of his right upper arm has been ongoing for a couple years.  He asked for my opinion of it.  On examination, it feels soft and benign.  No warmth, erythema, or tenderness.  Possible lipoma.  We will provide him with a referral to dermatology for further evaluation as well.  He expressed appreciation for this.  Will discharge.  Considered admission for this patient, but given his stable presentation and unremarkable work-up, he will likely benefit from admission.  Provided the patient with anticipatory guidance, return precautions, and educational material. Encouraged the patient to return to the emergency department at any time if they begin to  experience any new or worsening symptoms. Patient expressed understanding and agreed with the plan.   Patient's presentation is most consistent with acute presentation with potential threat to life or bodily function.       FINAL CLINICAL IMPRESSION(S) / ED DIAGNOSES   Final diagnoses:  Atypical chest pain     Rx / DC Orders   ED Discharge Orders          Ordered    Ambulatory referral to Cardiology       Comments: If you have not heard from the Cardiology office within the next 72 hours please call [phone].   11/04/22 2001  Note:  This document was prepared using Dragon voice recognition software and may include unintentional dictation errors.   Varney Daily, PA 11/04/22 2010    Varney Daily, [place] 11/04/22 2017    Willy Eddy, MD 11/04/22 [phone]

## 2022-11-12 ENCOUNTER — Encounter: Payer: Self-pay | Admitting: Internal Medicine

## 2022-11-12 ENCOUNTER — Ambulatory Visit: Payer: PRIVATE HEALTH INSURANCE | Attending: Internal Medicine | Admitting: Internal Medicine

## 2022-11-12 VITALS — BP 130/72 | HR 72 | Ht 74.0 in | Wt 226.0 lb

## 2022-11-12 DIAGNOSIS — R072 Precordial pain: Secondary | ICD-10-CM | POA: Diagnosis not present

## 2022-11-12 DIAGNOSIS — K2100 Gastro-esophageal reflux disease with esophagitis, without bleeding: Secondary | ICD-10-CM | POA: Insufficient documentation

## 2022-11-12 DIAGNOSIS — Z720 Tobacco use: Secondary | ICD-10-CM | POA: Diagnosis not present

## 2022-11-12 NOTE — Progress Notes (Signed)
Cardiology Office Note:    Date:  11/12/2022   ID:  Dakota Jenkins, DOB 1991/05/12, MRN [id number]  PCP:  Pcp, No   [facility] Providers Cardiologist:  None     Referring MD: Varney Daily, PA   CC: Chest pain Consulted for the evaluation of CP f/u at the behest of Mr. Dakota Jenkins  History of Present Illness:    Dakota Jenkins is a 31 y.o. male with a hx of atypical CP. Tobacco Abuse.  Patient notes that he is feeling better from chest pain.   This was in 11/04/22. Occurred after eating dinner.  Spicy chicken and gumbo soup.    Since then has had occasional chest pain.  Took Gas X and this improved.   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    No shortness of breath, DOE .  No PND or orthopnea.  No weight gain, leg swelling , or abdominal swelling.  No syncope or near syncope . Notes  no palpitations or funny heart beats.     Patient reports prior cardiac testing including SR at ED visit 11/04/22.  No past medical history on file.  No past surgical history on file.  Current Medications: Current Meds  Medication Sig   ketorolac (TORADOL) 10 MG tablet Take 1 tablet (10 mg total) by mouth every 8 (eight) hours as needed for moderate pain (with food).   ondansetron (ZOFRAN ODT) 4 MG disintegrating tablet Take 1 tablet (4 mg total) by mouth every 8 (eight) hours as needed for nausea or vomiting.   tamsulosin (FLOMAX) 0.4 MG CAPS capsule Take 1 capsule (0.4 mg total) by mouth daily.     Allergies:   Patient has no known allergies.   Social History   Socioeconomic History   Marital status: Single    Spouse name: Not on file   Number of children: Not on file   Years of education: Not on file   Highest education level: Not on file  Occupational History   Not on file  Tobacco Use   Smoking status: Every Day   Smokeless tobacco: Current  Vaping Use   Vaping Use: Never used  Substance and Sexual Activity   Alcohol  use: Yes   Drug use: Never   Sexual activity: Not on file  Other Topics Concern   Not on file  Social History Narrative   Not on file   Social Determinants of Health   Financial Resource Strain: Not on file  Food Insecurity: Not on file  Transportation Needs: Not on file  Physical Activity: Not on file  Stress: Not on file  Social Connections: Not on file     Family History: The patient's family history is negative for Bladder Cancer, Prostate cancer, and Kidney cancer. Does not not father's family  ROS:   Please see the history of present illness.     All other systems reviewed and are negative.  EKGs/Labs/Other Studies Reviewed:    The following studies were reviewed today:  Recent Labs: 04/13/2022: ALT 24 11/04/2022: BUN 14; Creatinine, Ser 1.08; Hemoglobin 13.7; Platelets 213; Potassium 3.5; Sodium 137  Recent Lipid Panel No results found for: "CHOL", "TRIG", "HDL", "CHOLHDL", "VLDL", "LDLCALC", "LDLDIRECT"   Physical Exam:    VS:  BP 130/72   Pulse 72   Ht 6\' 2"  (1.88 m)   Wt 226 lb (102.5 kg)   SpO2 98%   BMI 29.02 kg/m     Wt Readings  from Last 3 Encounters:  11/12/22 226 lb (102.5 kg)  11/04/22 239 lb 13.8 oz (108.8 kg)  04/13/22 250 lb (113.4 kg)    GEN:  Well nourished, well developed in no acute distress HEENT: Normal NECK: No JVD LYMPHATICS: No lymphadenopathy CARDIAC: RRR, no murmurs, rubs, gallops RESPIRATORY:  Clear to auscultation without rales, wheezing or rhonchi  ABDOMEN: Soft, non-tender, non-distended MUSCULOSKELETAL:  No edema; No deformity  SKIN: Warm and dry NEUROLOGIC:  Alert and oriented x 3 PSYCHIATRIC:  Normal affect   ASSESSMENT:    1. Precordial chest pain   2. Tobacco abuse   3. Gastroesophageal reflux disease with esophagitis, unspecified whether hemorrhage    PLAN:    Non-cardiac CP Tobacco abuse  GERD Stress - this is likely GERD - we talked about cessation of tobacco - we discussed lifestyle changes; he  has made some with improvement of sx - we discuss OTC therapy - he is working to get set up with PCP - we discussed POET if no improvement with GERD therapy  PRN f/u         Medication Adjustments/Labs and Tests Ordered: Current medicines are reviewed at length with the patient today.  Concerns regarding medicines are outlined above.  No orders of the defined types were placed in this encounter.  No orders of the defined types were placed in this encounter.   Patient Instructions  Medication Instructions:  Your physician recommends that you continue on your current medications as directed. Please refer to the Current Medication list given to you today.  *If you need a refill on your cardiac medications before your next appointment, please call your pharmacy*   Lab Work: NONE If you have labs (blood work) drawn today and your tests are completely normal, you will receive your results only by: MyChart Message (if you have MyChart) OR A paper copy in the mail If you have any lab test that is abnormal or we need to change your treatment, we will call you to review the results.   Testing/Procedures: NONE   Follow-Up: At [facility], you and your health needs are our priority.  As part of our continuing mission to provide you with exceptional heart care, we have created designated Provider Care Teams.  These Care Teams include your primary Cardiologist (physician) and Advanced Practice Providers (APPs -  Physician Assistants and Nurse Practitioners) who all work together to provide you with the care you need, when you need it.  We recommend signing up for the patient portal called "MyChart".  Sign up information is provided on this After Visit Summary.  MyChart is used to connect with patients for Virtual Visits (Telemedicine).  Patients are able to view lab/test results, encounter notes, upcoming appointments, etc.  Non-urgent messages can be sent to your provider as  well.   To learn more about what you can do with MyChart, go to [email or web address].    Your next appointment:   As Needed  The format for your next appointment:   In Person  Provider:   Riley Lam, MD    Important Information About Sugar         Signed, Christell Constant, MD  11/12/2022 3:53 PM    [facility]

## 2022-11-12 NOTE — Patient Instructions (Signed)
Medication Instructions:  Your physician recommends that you continue on your current medications as directed. Please refer to the Current Medication list given to you today.  *If you need a refill on your cardiac medications before your next appointment, please call your pharmacy*   Lab Work: NONE If you have labs (blood work) drawn today and your tests are completely normal, you will receive your results only by: MyChart Message (if you have MyChart) OR A paper copy in the mail If you have any lab test that is abnormal or we need to change your treatment, we will call you to review the results.   Testing/Procedures: NONE   Follow-Up: At [facility], you and your health needs are our priority.  As part of our continuing mission to provide you with exceptional heart care, we have created designated Provider Care Teams.  These Care Teams include your primary Cardiologist (physician) and Advanced Practice Providers (APPs -  Physician Assistants and Nurse Practitioners) who all work together to provide you with the care you need, when you need it.  We recommend signing up for the patient portal called "MyChart".  Sign up information is provided on this After Visit Summary.  MyChart is used to connect with patients for Virtual Visits (Telemedicine).  Patients are able to view lab/test results, encounter notes, upcoming appointments, etc.  Non-urgent messages can be sent to your provider as well.   To learn more about what you can do with MyChart, go to [email or web address].    Your next appointment:   As Needed  The format for your next appointment:   In Person  Provider:   Riley Lam, MD    Important Information About Sugar

## 2023-06-29 IMAGING — CR DG CHEST 2V
3 series · 3 of 3 positions shown · non-contrast
Comparison: None.

CLINICAL DATA: Chest pain

EXAM:
CHEST - 2 VIEW

[chest pa (1 of 2)]
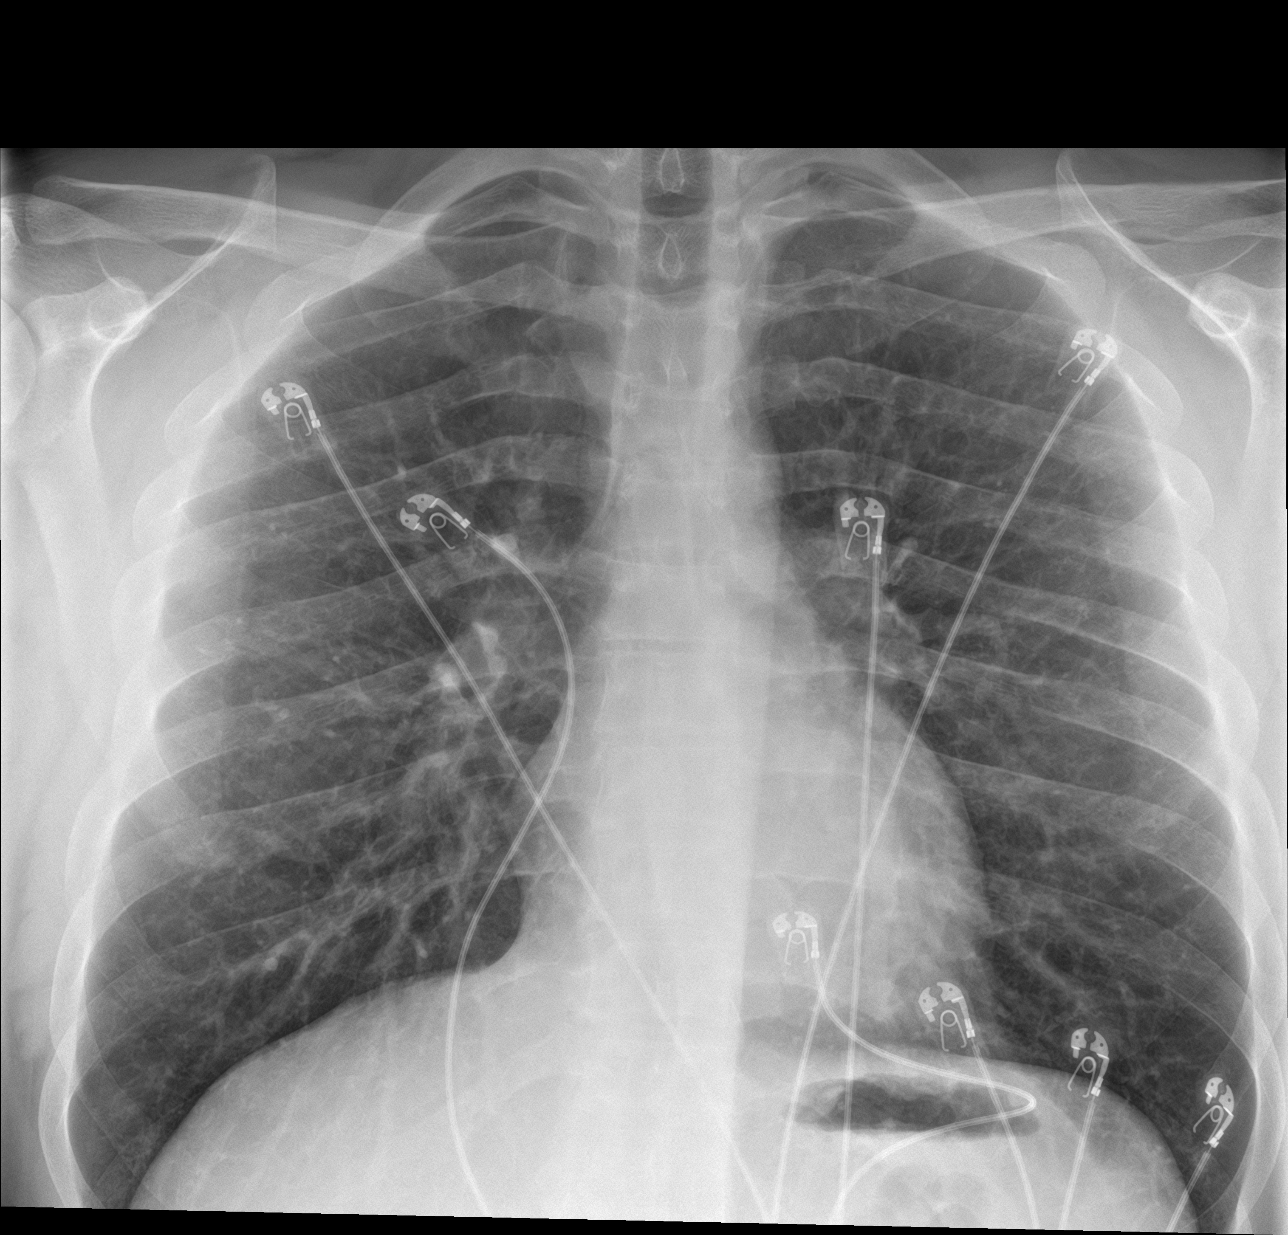

[chest lat]
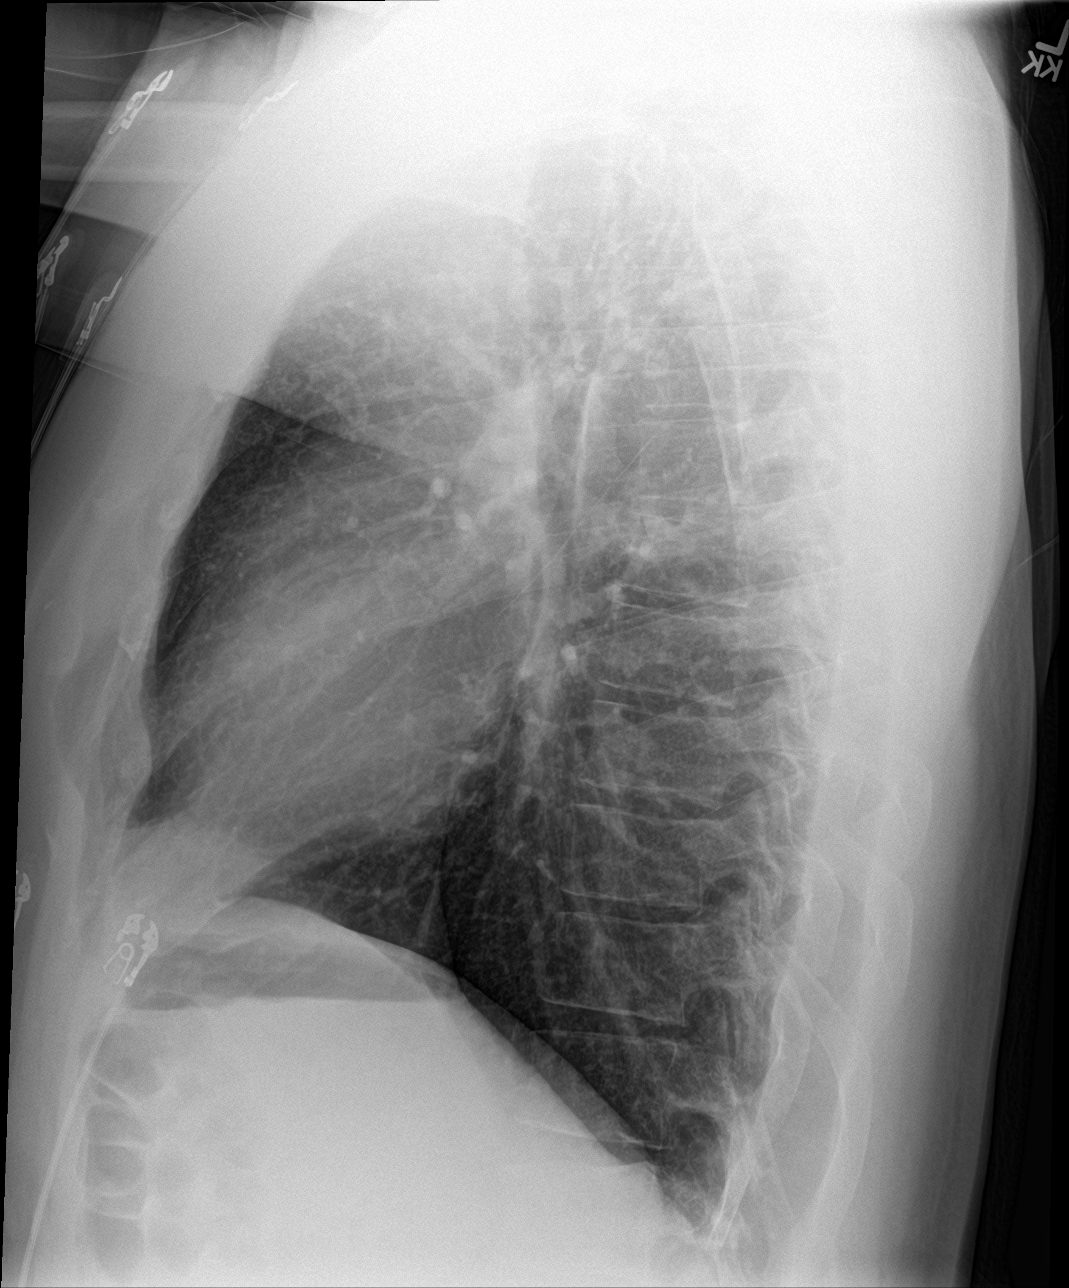

[chest pa (2 of 2)]
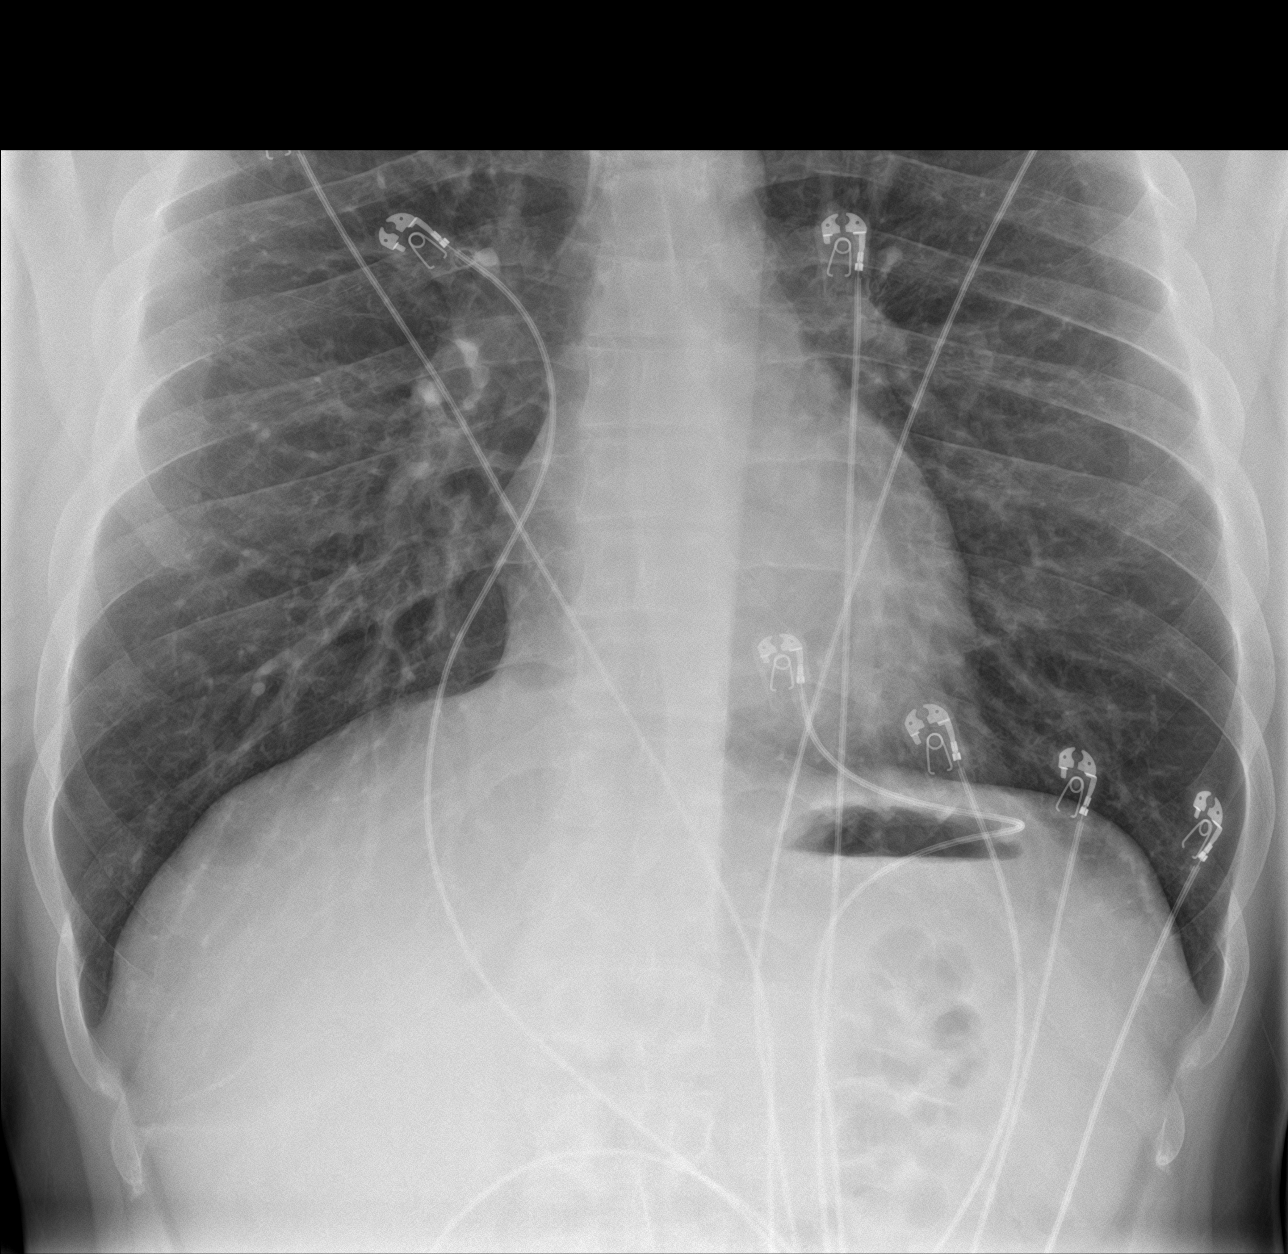

[3 of 3 positions shown; findings below may reference images not displayed]

FINDINGS: The heart size and mediastinal contours are within normal limits.
Both lungs are clear. The visualized skeletal structures are
unremarkable.
IMPRESSION: No active cardiopulmonary disease.

## 2023-09-24 ENCOUNTER — Ambulatory Visit (INDEPENDENT_AMBULATORY_CARE_PROVIDER_SITE_OTHER): Payer: 59 | Admitting: Gastroenterology

## 2023-09-24 ENCOUNTER — Other Ambulatory Visit: Payer: Self-pay

## 2023-09-24 ENCOUNTER — Encounter: Payer: Self-pay | Admitting: Gastroenterology

## 2023-09-24 VITALS — BP 142/73 | HR 73 | Temp 98.1°F | Ht 74.0 in | Wt 209.0 lb

## 2023-09-24 DIAGNOSIS — R1013 Epigastric pain: Secondary | ICD-10-CM | POA: Diagnosis not present

## 2023-09-24 DIAGNOSIS — R140 Abdominal distension (gaseous): Secondary | ICD-10-CM

## 2023-09-24 DIAGNOSIS — R1032 Left lower quadrant pain: Secondary | ICD-10-CM

## 2023-09-24 DIAGNOSIS — K921 Melena: Secondary | ICD-10-CM

## 2023-09-24 DIAGNOSIS — K625 Hemorrhage of anus and rectum: Secondary | ICD-10-CM

## 2023-09-24 DIAGNOSIS — R634 Abnormal weight loss: Secondary | ICD-10-CM

## 2023-09-24 DIAGNOSIS — R194 Change in bowel habit: Secondary | ICD-10-CM

## 2023-09-24 DIAGNOSIS — K648 Other hemorrhoids: Secondary | ICD-10-CM

## 2023-09-24 MED ORDER — NA SULFATE-K SULFATE-MG SULF 17.5-3.13-1.6 GM/177ML PO SOLN: 354.0000 mL | Freq: Once | ORAL | 0 refills | Status: AC

## 2023-09-24 NOTE — Progress Notes (Signed)
Wyline Mood MD, MRCP(U.K) [address]  [address]  [place], [place] [address]  Main: [phone]  Fax: [phone]   Gastroenterology Consultation  Referring Provider:     Claris Pong, PA Primary Care Physician:  Pcp, No Primary Gastroenterologist:  Dr. Wyline Mood  Reason for Consultation:     Abdominal pain, change in bowel habits and abnormal weight loss        HPI:   Dakota Jenkins is a 32 y.o. y/o male referred for abdominal pain.  Back in March 2024 was seen at urgent care for reflux-like symptoms and bowel habit changes.  Next No recent labs.  He says that about a year back he started developing some occasional upper abdominal discomfort symptoms.  Not related particular to meals feels like a spasm tightness in his chest but has occurred very infrequently.  Sometimes related to type of food he eats.  He is also noted some left lower quadrant pain usually before a bowel movement he has also noticed blood on the tissue paper when he wipes.  His grandmother had colon cancer.  No change in shape of his stools.  He says he has lost 10 pounds he is unsure if it is intentional or unintentional.  Never had a colonoscopy.  He does suffer from passage of hard stools at times.Marland Kitchen  He complains of bloating has been using a probiotic he also has had some perianal discomfort  No past medical history on file.  No past surgical history on file.  Prior to Admission medications   Medication Sig Start Date End Date Taking? Authorizing Provider  ketorolac (TORADOL) 10 MG tablet Take 1 tablet (10 mg total) by mouth every 8 (eight) hours as needed for moderate pain (with food). 08/05/18   Rockne Menghini, MD  ondansetron (ZOFRAN ODT) 4 MG disintegrating tablet Take 1 tablet (4 mg total) by mouth every 8 (eight) hours as needed for nausea or vomiting. 08/05/18   Rockne Menghini, MD  tamsulosin (FLOMAX) 0.4 MG CAPS capsule Take 1 capsule (0.4 mg total) by mouth daily. 08/05/18    Rockne Menghini, MD    Family History  Problem Relation Age of Onset   Bladder Cancer Neg Hx    Prostate cancer Neg Hx    Kidney cancer Neg Hx      Social History   Tobacco Use   Smoking status: Every Day   Smokeless tobacco: Current  Vaping Use   Vaping status: Never Used  Substance Use Topics   Alcohol use: Yes   Drug use: Never    Allergies as of 09/24/2023   (No Known Allergies)    Review of Systems:    All systems reviewed and negative except where noted in HPI.   Physical Exam:  There were no vitals taken for this visit. No LMP for male patient. Psych:  Alert and cooperative. Normal mood and affect. General:   Alert,  Well-developed, well-nourished, pleasant and cooperative in NAD Head:  Normocephalic and atraumatic. Eyes:  Sclera clear, no icterus.   Conjunctiva pink. Ears:  Normal auditory acuity. Neck:  Supple; no masses or thyromegaly. Lungs:  Respirations even and unlabored.  Clear throughout to auscultation.   No wheezes, crackles, or rhonchi. No acute distress. Heart:  Regular rate and rhythm; no murmurs, clicks, rubs, or gallops. Abdomen:  Normal bowel sounds.  No bruits.  Soft, non-tender and non-distended without masses, hepatosplenomegaly or hernias noted.  No guarding or rebound tenderness.    Neurologic:  Alert and oriented x3;  grossly normal neurologically. Psych:  Alert and cooperative. Normal mood and affect.  Imaging Studies: No results found.  Assessment and Plan:   Dakota Jenkins is a 32 y.o. y/o male has been referred for abdominal pain, change in bowel habits and abnormal weight loss.  His upper GI symptoms are probably related to bloating which could be related to the probiotic.  His left lower abdominal discomfort may be related to constipation, and his blood in the tissue being related to hemorrhoids.  Plan 1.  EGD and colonoscopy to evaluate for upper abdominal discomfort as well as rectal bleeding.  Will rule out EOE  2.   High-fiber diet patient information provided  3.  Conservative management of internal hemorrhoids including perianal toileting and hygiene discussed and provided patient information  4.  Advised stop taking probiotics as it can cause bloating.  5.  At next visit if his left lower quadrant pain is no better despite high-fiber diet and consider starting on low-dose Linzess or MiraLAX  6.  He is unsure if his weight loss is intentional or unintentional.  If upper endoscopy and colonoscopy are negative and he continues to lose weight then may need imaging at subsequent visit, checking of TSH, celiac serology   I have discussed alternative options, risks & benefits,  which include, but are not limited to, bleeding, infection, perforation,respiratory complication & drug reaction.  The patient agrees with this plan & written consent will be obtained.     Follow up in 8 weeks with Celso Amy  Dr Wyline Mood MD,MRCP(U.K)

## 2023-09-24 NOTE — Patient Instructions (Signed)
Hemorrhoids Hemorrhoids are swollen veins that may form: In the butt (rectum). These are called internal hemorrhoids. Around the opening of the butt (anus). These are called external hemorrhoids. Most hemorrhoids do not cause very bad problems. They often get better with changes to your lifestyle and what you eat. What are the causes? Having trouble pooping (constipation) or watery poop (diarrhea). Pushing too hard when you poop. Pregnancy. Being very overweight (obese). Sitting for too long. Riding a bike for a long time. Heavy lifting or other things that take a lot of effort. Anal sex. What are the signs or symptoms? Pain. Itching or soreness in the butt. Bleeding from the butt. Leaking poop. Swelling. One or more lumps around the opening of your butt. How is this treated? In most cases, hemorrhoids can be treated at home. You may be told to: Change what you eat. Make changes to your lifestyle. If these treatments do not help, you may need to have a procedure done. Your doctor may need to: Place rubber bands at the bottom of the hemorrhoids to make them fall off. Put medicine into the hemorrhoids to shrink them. Shine a type of light on the hemorrhoids to cause them to fall off. Do surgery to get rid of the hemorrhoids. Follow these instructions at home: Medicines Take over-the-counter and prescription medicines only as told by your doctor. Use creams with medicine in them or medicines that you put in your butt as told by your doctor. Eating and drinking  Eat foods that have a lot of fiber in them. These include whole grains, beans, nuts, fruits, and vegetables. Ask your doctor about taking products that have fiber added to them (fibersupplements). Take in less fat. You can do this by: Eating low-fat dairy products. Eating less red meat. Staying away from processed foods. Drink enough fluid to keep your pee (urine) pale yellow. Managing pain and swelling  Take a  warm-water bath (sitz bath) for 20 minutes to ease pain. Do this 3-4 times a day. You may do this in a bathtub. You may also use a portable sitz bath that fits over the toilet. If told, put ice on the painful area. It may help to use ice between your warm baths. Put ice in a plastic bag. Place a towel between your skin and the bag. Leave the ice on for 20 minutes, 2-3 times a day. If your skin turns bright red, take off the ice right away to prevent skin damage. The risk of damage is higher if you cannot feel pain, heat, or cold. General instructions Exercise. Ask your doctor how much and what kind of exercise is best for you. Go to the bathroom when you need to poop. Do not wait. Try not to push too hard when you poop. Keep your butt dry and clean. Use wet toilet paper or moist towelettes after you poop. Do not sit on the toilet for a long time. Contact a doctor if: You have pain and swelling that do not get better with treatment. You have trouble pooping. You cannot poop. You have pain or swelling outside the area of the hemorrhoids. Get help right away if: You have bleeding from the butt that will not stop. This information is not intended to replace advice given to you by your health care provider. Make sure you discuss any questions you have with your health care provider. Document Revised: 08/13/2022 Document Reviewed: 08/13/2022 Elsevier Patient Education  2024 [employer]  

## 2023-09-28 ENCOUNTER — Telehealth: Payer: Self-pay

## 2023-09-28 NOTE — Telephone Encounter (Signed)
Per  pt waiting on employer to give him information about his medical insurance. Pt only have dental insurance information. Per pt will call [employer] once he get the medical plan information.

## 2023-10-21 ENCOUNTER — Telehealth: Payer: Self-pay | Admitting: Gastroenterology

## 2023-10-21 NOTE — Telephone Encounter (Signed)
Patient called in to reschedule his procedure

## 2023-10-21 NOTE — Telephone Encounter (Signed)
Called patient back and he stated that he needs to reschedule his procedure date because he is sick. Therefore, I offered him some dates and he agreed on changing it to 11/17/2023. I then called the endoscopy unit and spoke to [facility] and I let her know of the changes.

## 2023-11-16 ENCOUNTER — Telehealth: Payer: Self-pay

## 2023-11-16 NOTE — Telephone Encounter (Signed)
Trish from the endoscopy unit called stating that they had called patient to notify him the time of his arrival for his procedures and he stated that he totally forgot about it and that he would have to cancel. Trish then stated that he would give [place] a call as in when to reschedule his procedures. I had not received a call from patient, therefore, I gave him a call and was not able to leave him a voicemail as it is full.

## 2023-11-17 ENCOUNTER — Ambulatory Visit: Admission: RE | Admit: 2023-11-17 | Payer: 59 | Source: Home / Self Care | Admitting: Gastroenterology

## 2023-11-17 SURGERY — ESOPHAGOGASTRODUODENOSCOPY (EGD) WITH PROPOFOL
Anesthesia: General

## 2023-11-23 ENCOUNTER — Other Ambulatory Visit: Payer: Self-pay

## 2023-11-24 ENCOUNTER — Ambulatory Visit: Payer: PRIVATE HEALTH INSURANCE | Admitting: Physician Assistant

## 2023-11-24 NOTE — Progress Notes (Unsigned)
    Celso Amy, PA-C [address]  [address]  [place], [place] [address]  Main: [phone]  Fax: [phone]   Primary Care Physician: Pcp, No  Primary Gastroenterologist:  ***  CC:  HPI: Dakota Jenkins is a 32 y.o. male  Current Outpatient Medications  Medication Sig Dispense Refill   cyclobenzaprine (FLEXERIL) 10 MG tablet Take by mouth.     hydrocortisone (ANUSOL-HC) 2.5 % rectal cream Apply topically 2 (two) times daily.     omeprazole (PRILOSEC) 20 MG capsule Take 20 mg by mouth daily.     Simethicone (GAS-X ULTRA STRENGTH) 180 MG CAPS Take 1 capsule by mouth as needed.     No current facility-administered medications for this visit.    Allergies as of 11/24/2023   (No Known Allergies)    Past Medical History:  Diagnosis Date   Impingement syndrome of left shoulder region 08/07/2020    No past surgical history on file.  Review of Systems:    All systems reviewed and negative except where noted in HPI.   Physical Examination:   There were no vitals taken for this visit.  General: Well-nourished, well-developed in no acute distress.  Lungs: Clear to auscultation bilaterally. Non-labored. Heart: Regular rate and rhythm, no murmurs rubs or gallops.  Abdomen: Bowel sounds are normal; Abdomen is Soft; No hepatosplenomegaly, masses or hernias;  No Abdominal Tenderness; No guarding or rebound tenderness. Neuro: Alert and oriented x 3.  Grossly intact.  Psych: Alert and cooperative, normal mood and affect.   Imaging Studies: No results found.  Assessment and Plan:   Dakota Jenkins is a 32 y.o. y/o male ***    Celso Amy, PA-C  Follow up ***  BP check ***

## 2024-01-07 ENCOUNTER — Emergency Department
Admission: EM | Admit: 2024-01-07 | Discharge: 2024-01-07 | Disposition: A | Discharge status: Home / Self Care | Payer: 59 | Attending: Emergency Medicine | Admitting: Emergency Medicine

## 2024-01-07 ENCOUNTER — Emergency Department: Payer: 59

## 2024-01-07 ENCOUNTER — Other Ambulatory Visit: Payer: Self-pay

## 2024-01-07 DIAGNOSIS — J219 Acute bronchiolitis, unspecified: Secondary | ICD-10-CM | POA: Insufficient documentation

## 2024-01-07 DIAGNOSIS — R0789 Other chest pain: Secondary | ICD-10-CM | POA: Diagnosis present

## 2024-01-07 DIAGNOSIS — Z20822 Contact with and (suspected) exposure to covid-19: Secondary | ICD-10-CM | POA: Insufficient documentation

## 2024-01-07 DIAGNOSIS — J329 Chronic sinusitis, unspecified: Secondary | ICD-10-CM | POA: Insufficient documentation

## 2024-01-07 LAB — CBC WITH DIFFERENTIAL/PLATELET
Abs Immature Granulocytes: 0.02 10*3/uL (ref 0.00–0.07)
Basophils Absolute: 0 10*3/uL (ref 0.0–0.1)
Basophils Relative: 0 %
Eosinophils Absolute: 0.1 10*3/uL (ref 0.0–0.5)
Eosinophils Relative: 2 %
HCT: 41 % (ref 39.0–52.0)
Hemoglobin: 14.4 g/dL (ref 13.0–17.0)
Immature Granulocytes: 0 %
Lymphocytes Relative: 17 %
Lymphs Abs: 1.3 10*3/uL (ref 0.7–4.0)
MCH: 31.3 pg (ref 26.0–34.0)
MCHC: 35.1 g/dL (ref 30.0–36.0)
MCV: 89.1 fL (ref 80.0–100.0)
Monocytes Absolute: 0.7 10*3/uL (ref 0.1–1.0)
Monocytes Relative: 9 %
Neutro Abs: 5.6 10*3/uL (ref 1.7–7.7)
Neutrophils Relative %: 72 %
Platelets: 281 10*3/uL (ref 150–400)
RBC: 4.6 MIL/uL (ref 4.22–5.81)
RDW: 11.6 % (ref 11.5–15.5)
WBC: 7.8 10*3/uL (ref 4.0–10.5)
nRBC: 0 % (ref 0.0–0.2)

## 2024-01-07 LAB — COMPREHENSIVE METABOLIC PANEL
ALT: 18 U/L (ref 0–44)
AST: 22 U/L (ref 15–41)
Albumin: 4.8 g/dL (ref 3.5–5.0)
Alkaline Phosphatase: 67 U/L (ref 38–126)
Anion gap: 13 (ref 5–15)
BUN: 14 mg/dL (ref 6–20)
CO2: 21 mmol/L — ABNORMAL LOW (ref 22–32)
Calcium: 9.6 mg/dL (ref 8.9–10.3)
Chloride: 102 mmol/L (ref 98–111)
Creatinine, Ser: 0.97 mg/dL (ref 0.61–1.24)
GFR, Estimated: >60 mL/min (ref >60)
Glucose, Bld: 178 mg/dL — ABNORMAL HIGH (ref 70–99)
Potassium: 3.3 mmol/L — ABNORMAL LOW (ref 3.5–5.1)
Sodium: 136 mmol/L (ref 135–145)
Total Bilirubin: 1.1 mg/dL (ref 0.0–1.2)
Total Protein: 8.2 g/dL — ABNORMAL HIGH (ref 6.5–8.1)

## 2024-01-07 LAB — TROPONIN I (HIGH SENSITIVITY)
Troponin I (High Sensitivity): 3 ng/L (ref <18)
Troponin I (High Sensitivity): 3 ng/L (ref <18)

## 2024-01-07 LAB — MONONUCLEOSIS SCREEN: Mono Screen: NEGATIVE

## 2024-01-07 LAB — RESP PANEL BY RT-PCR (RSV, FLU A&B, COVID)  RVPGX2
Influenza A by PCR: NEGATIVE
Influenza B by PCR: NEGATIVE
Resp Syncytial Virus by PCR: NEGATIVE
SARS Coronavirus 2 by RT PCR: NEGATIVE

## 2024-01-07 MED ORDER — IOHEXOL 350 MG/ML SOLN
100.0000 mL | Freq: Once | INTRAVENOUS | Status: AC | PRN
  Administered 2024-01-07: 100 mL via INTRAVENOUS

## 2024-01-07 MED ORDER — ALBUTEROL SULFATE HFA 108 (90 BASE) MCG/ACT IN AERS: 2.0000 | INHALATION_SPRAY | Freq: Four times a day (QID) | RESPIRATORY_TRACT | 2 refills | Status: AC | PRN

## 2024-01-07 MED ORDER — PREDNISONE 20 MG PO TABS: 40.0000 mg | ORAL_TABLET | Freq: Every day | ORAL | 0 refills | Status: AC

## 2024-01-07 MED ORDER — ACETAMINOPHEN 500 MG PO TABS
1000.0000 mg | ORAL_TABLET | Freq: Once | ORAL | Status: AC
  Administered 2024-01-07: 1000 mg via ORAL
  Filled 2024-01-07: qty 2

## 2024-01-07 MED ORDER — AMOXICILLIN-POT CLAVULANATE 875-125 MG PO TABS: 1.0000 | ORAL_TABLET | Freq: Two times a day (BID) | ORAL | 0 refills | Status: AC

## 2024-01-07 NOTE — ED Provider Triage Note (Signed)
Emergency Medicine Provider Triage Evaluation Note  Dakota Jenkins , a 33 y.o. male  was evaluated in triage.  Pt complains of sense of impending doom, shivering, feet are cold compared to hands and rest of body, some left-sided chest pain.  Symptoms started suddenly while sitting on the sofa with his daughter.  Has not been sick recently.  States something is just not right.  Review of Systems  Positive:  Negative:   Physical Exam  BP (!) 153/90   Pulse (!) 104   Temp 98 F (36.7 C)   Resp 18   Ht 6\' 2"  (1.88 m)   Wt 99.8 kg   SpO2 100%   BMI 28.25 kg/m  Gen:   Awake, no distress   Resp:  Normal effort  MSK:   Moves extremities without difficulty  Other:    Medical Decision Making  Medically screening exam initiated at 12:49 PM.  Appropriate orders placed.  LENUS DEMATTEO was informed that the remainder of the evaluation will be completed by another provider, this initial triage assessment does not replace that evaluation, and the importance of remaining in the ED until their evaluation is complete.     Faythe Ghee, PA-C 01/07/24 1250

## 2024-01-07 NOTE — ED Provider Notes (Signed)
[facility] Provider Note    Event Date/Time   First MD Initiated Contact with Patient 01/07/24 1345     (approximate)   History   No chief complaint on file.   HPI  Dakota Jenkins is a 33 y.o. male who is otherwise healthy who comes in with concerns for chest pain.  Patient reports that he was just sitting there with his daughter when he had sudden onset of chest pressure.  He stated that he felt this impending sense of doom and that he felt coolness sweep over him .  He reports having these episodes previously and being seen.  He is also reports seeing cardiology and they sent him to a GI doctor.  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   Physical Exam   Triage Vital Signs: ED Triage Vitals  Encounter Vitals Group     BP 01/07/24 1248 (!) 153/90     Systolic BP Percentile --      Diastolic BP Percentile --      Pulse Rate 01/07/24 1248 (!) 104     Resp 01/07/24 1248 18     Temp 01/07/24 1248 98 F (36.7 C)     Temp src --      SpO2 01/07/24 1248 100 %     Weight 01/07/24 1246 220 lb (99.8 kg)     Height 01/07/24 1246 6\' 2"  (1.88 m)     Head Circumference --      Peak Flow --      Pain Score 01/07/24 1246 0     Pain Loc --      Pain Education --      Exclude from Growth Chart --     Most recent vital signs: Vitals:   01/07/24 1248  BP: (!) 153/90  Pulse: (!) 104  Resp: 18  Temp: 98 F (36.7 C)  SpO2: 100%     General: Awake, no distress.  CV:  Good peripheral perfusion.  Resp:  Normal effort.  Abd:  No distention.  Soft and nontender Other:  No swelling in legs   ED Results / Procedures / Treatments   Labs (all labs ordered are listed, but only abnormal results are displayed) Labs Reviewed  COMPREHENSIVE METABOLIC PANEL - Abnormal; Notable for the following components:      Result Value   Potassium 3.3 (*)    CO2 21 (*)     Glucose, Bld 178 (*)    Total Protein 8.2 (*)    All other components within normal limits  RESP PANEL BY RT-PCR (RSV, FLU A&B, COVID)  RVPGX2  CBC WITH DIFFERENTIAL/PLATELET  URINALYSIS, ROUTINE W REFLEX MICROSCOPIC  TROPONIN I (HIGH SENSITIVITY)  TROPONIN I (HIGH SENSITIVITY)     EKG  My interpretation of EKG:  Normal sinus rhythm 99 without any ST elevation or T wave inversions, normal intervals  RADIOLOGY I have reviewed the CT head personally interpreted and no evidence of intracranial process  PROCEDURES:  Critical Care performed: No  Procedures   MEDICATIONS ORDERED IN ED: Medications  iohexol (OMNIPAQUE) 350 MG/ML injection 100 mL (100 mLs Intravenous Contrast Given 01/07/24 1348)     IMPRESSION / MDM / ASSESSMENT AND PLAN / ED COURSE  I reviewed the triage vital signs and the nursing notes.   Patient's presentation is most consistent with acute presentation with potential  threat to life or bodily function.    Differential includes ACS.  CT imaging ordered from triage to evaluate for dissection.  We discussed that today on what we can find and that given he has had these recurrent episodes that he may need to have PCP follow-up for further evaluation.  We also discussed how anxiety can sometimes make things worse he does report being willing to explore this as well.  COVID, flu are negative.  CMP shows stable creatinine.  Troponin is negative.  CBC reassuring.  IMPRESSION: 1. No acute intracranial process. 2. Paranasal sinus disease, with bubbly fluid in the frontal sinuses, which can be seen in the setting of acute sinusitis.   IMPRESSION: 1. Normal contour and caliber of the thoracic and abdominal aorta. No evidence of aneurysm, dissection, or other acute aortic pathology. No significant aortic atherosclerosis. 2. Mild diffuse bilateral bronchial wall thickening and diffuse, extensive fine centrilobular nodularity throughout the lungs, most commonly  seen in smoking-related respiratory bronchiolitis. 3. Mild splenomegaly.  Discussed with patient he has had some sinus symptoms.  He also does still vape and we discussed stopping the vaping as this can cause some shortness of breath and findings on his CT scan.  Will try some steroids, albuterol inhaler, Augmentin for possible sinusitis have him follow-up outpatient with a PCP to have this episodes further worked up and he can return to the ER if develops worsening symptoms or any other concerns.  Will handoff pending repeat troponin   FINAL CLINICAL IMPRESSION(S) / ED DIAGNOSES   Final diagnoses:  Sinusitis, unspecified chronicity, unspecified location  Bronchiolitis     Rx / DC Orders   ED Discharge Orders          Ordered    predniSONE (DELTASONE) 20 MG tablet  Daily with breakfast        01/07/24 1528    amoxicillin-clavulanate (AUGMENTIN) 875-125 MG tablet  2 times daily        01/07/24 1528    albuterol (VENTOLIN HFA) 108 (90 Base) MCG/ACT inhaler  Every 6 hours PRN        01/07/24 1528    Ambulatory Referral to Primary Care (Establish Care)        01/07/24 1529             Note:  This document was prepared using Dragon voice recognition software and may include unintentional dictation errors.   Concha Se, MD 01/07/24 [phone]

## 2024-01-07 NOTE — Discharge Instructions (Addendum)
As for possible lung infection, sinusitis.  Stop vaping as this can worsen your lungs and contribute to shortness of breath, chest pain.  You should call your cardiologist to make a follow-up appointment to discuss your chest pain further and to have this reevaluated.  I also listed a another cardiology number if you want a second opinion.  I have also referred you to a primary care doctor.   IMPRESSION: 1. No acute intracranial process. 2. Paranasal sinus disease, with bubbly fluid in the frontal sinuses, which can be seen in the setting of acute sinusitis.   IMPRESSION: 1. Normal contour and caliber of the thoracic and abdominal aorta. No evidence of aneurysm, dissection, or other acute aortic pathology. No significant aortic atherosclerosis. 2. Mild diffuse bilateral bronchial wall thickening and diffuse, extensive fine centrilobular nodularity throughout the lungs, most commonly seen in smoking-related respiratory bronchiolitis. 3. Mild splenomegaly.

## 2024-01-07 NOTE — ED Provider Notes (Signed)
-----------------------------------------   4:47 PM on 01/07/2024 -----------------------------------------  I took over care of this patient from Dr. Fuller Plan.  Repeat troponin and mononucleosis screen are both negative.  On reassessment, the patient is comfortable appearing.  I counseled him on the results of the workup and plan of care.  I gave strict return precautions and he expressed understanding.   Dionne Bucy, MD 01/07/24 [phone]

## 2024-01-07 NOTE — ED Triage Notes (Signed)
Pt to ED For chest pain, feet feeling cold. Hx heart murmur. Has sense of impending doom

## 2024-02-17 ENCOUNTER — Ambulatory Visit: Payer: 59 | Admitting: Family Medicine

## 2024-04-06 ENCOUNTER — Ambulatory Visit: Admitting: Family Medicine
